# Patient Record
Sex: Female | Born: 1945
Health system: Southern US, Community
[De-identification: ages and names within clinical notes are randomized; demographics above are authoritative.]

## PROBLEM LIST (undated history)

## (undated) DIAGNOSIS — Z9989 Dependence on other enabling machines and devices: Secondary | ICD-10-CM

## (undated) DIAGNOSIS — K219 Gastro-esophageal reflux disease without esophagitis: Secondary | ICD-10-CM

## (undated) DIAGNOSIS — I5032 Chronic diastolic (congestive) heart failure: Secondary | ICD-10-CM

## (undated) DIAGNOSIS — N189 Chronic kidney disease, unspecified: Secondary | ICD-10-CM

## (undated) DIAGNOSIS — E039 Hypothyroidism, unspecified: Secondary | ICD-10-CM

## (undated) DIAGNOSIS — M1990 Unspecified osteoarthritis, unspecified site: Secondary | ICD-10-CM

## (undated) DIAGNOSIS — E669 Obesity, unspecified: Secondary | ICD-10-CM

## (undated) DIAGNOSIS — M109 Gout, unspecified: Secondary | ICD-10-CM

## (undated) DIAGNOSIS — E119 Type 2 diabetes mellitus without complications: Secondary | ICD-10-CM

## (undated) DIAGNOSIS — G4733 Obstructive sleep apnea (adult) (pediatric): Secondary | ICD-10-CM

## (undated) DIAGNOSIS — Z9289 Personal history of other medical treatment: Secondary | ICD-10-CM

## (undated) DIAGNOSIS — I10 Essential (primary) hypertension: Secondary | ICD-10-CM

## (undated) DIAGNOSIS — K760 Fatty (change of) liver, not elsewhere classified: Secondary | ICD-10-CM

## (undated) HISTORY — DX: Type 2 diabetes mellitus without complications: E11.9

## (undated) HISTORY — DX: Dependence on other enabling machines and devices: Z99.89

## (undated) HISTORY — PX: BREAST BIOPSY: SHX20

## (undated) HISTORY — DX: Personal history of other medical treatment: Z92.89

## (undated) HISTORY — DX: Chronic diastolic (congestive) heart failure: I50.32

## (undated) HISTORY — DX: Obstructive sleep apnea (adult) (pediatric): G47.33

## (undated) HISTORY — DX: Gastro-esophageal reflux disease without esophagitis: K21.9

## (undated) HISTORY — DX: Unspecified osteoarthritis, unspecified site: M19.90

## (undated) HISTORY — DX: Fatty (change of) liver, not elsewhere classified: K76.0

## (undated) HISTORY — DX: Hypothyroidism, unspecified: E03.9

## (undated) HISTORY — PX: BIOPSY BREAST: PRO8

## (undated) HISTORY — DX: Gout, unspecified: M10.9

## (undated) HISTORY — DX: Obesity, unspecified: E66.9

## (undated) HISTORY — DX: Chronic kidney disease, unspecified: N18.9

## (undated) HISTORY — DX: Essential (primary) hypertension: I10

---

## 2006-06-01 ENCOUNTER — Encounter: Admission: RE | Admit: 2006-06-01 | Discharge: 2006-08-30 | Payer: Self-pay | Admitting: Family Medicine

## 2006-10-25 ENCOUNTER — Encounter: Admission: RE | Admit: 2006-10-25 | Discharge: 2006-10-25 | Payer: Self-pay | Admitting: Obstetrics and Gynecology

## 2007-10-25 ENCOUNTER — Encounter (INDEPENDENT_AMBULATORY_CARE_PROVIDER_SITE_OTHER): Payer: Self-pay | Admitting: Family Medicine

## 2007-10-25 ENCOUNTER — Ambulatory Visit: Payer: Self-pay | Admitting: Vascular Surgery

## 2007-10-25 ENCOUNTER — Ambulatory Visit: Admission: RE | Admit: 2007-10-25 | Discharge: 2007-10-25 | Payer: Self-pay | Admitting: Family Medicine

## 2007-10-25 ENCOUNTER — Other Ambulatory Visit: Admission: RE | Admit: 2007-10-25 | Discharge: 2007-10-25 | Payer: Self-pay | Admitting: Family Medicine

## 2007-11-20 ENCOUNTER — Encounter: Admission: RE | Admit: 2007-11-20 | Discharge: 2007-11-20 | Payer: Self-pay | Admitting: Family Medicine

## 2008-11-20 ENCOUNTER — Encounter: Admission: RE | Admit: 2008-11-20 | Discharge: 2008-11-20 | Payer: Self-pay | Admitting: Family Medicine

## 2009-02-27 ENCOUNTER — Encounter: Admission: RE | Admit: 2009-02-27 | Discharge: 2009-02-27 | Payer: Self-pay | Admitting: Family Medicine

## 2009-05-19 ENCOUNTER — Encounter: Admission: RE | Admit: 2009-05-19 | Discharge: 2009-05-19 | Payer: Self-pay | Admitting: Gastroenterology

## 2009-11-23 ENCOUNTER — Encounter: Admission: RE | Admit: 2009-11-23 | Discharge: 2009-11-23 | Payer: Self-pay | Admitting: Family Medicine

## 2010-09-15 ENCOUNTER — Emergency Department (HOSPITAL_COMMUNITY): Admission: EM | Admit: 2010-09-15 | Discharge: 2010-09-15 | Payer: Self-pay | Admitting: Emergency Medicine

## 2010-11-24 ENCOUNTER — Encounter
Admission: RE | Admit: 2010-11-24 | Discharge: 2010-11-24 | Payer: Self-pay | Source: Home / Self Care | Admitting: Obstetrics and Gynecology

## 2011-03-03 LAB — URINALYSIS, ROUTINE W REFLEX MICROSCOPIC
Bilirubin Urine: NEGATIVE
Glucose, UA: NEGATIVE mg/dL
Nitrite: NEGATIVE
Protein, ur: NEGATIVE mg/dL
Specific Gravity, Urine: 1.009 (ref 1.005–1.030)
Urobilinogen, UA: 0.2 mg/dL (ref 0.0–1.0)
pH: 5.5 (ref 5.0–8.0)

## 2011-03-03 LAB — POCT I-STAT, CHEM 8
BUN: 43 mg/dL — ABNORMAL HIGH (ref 6–23)
Potassium: 4.2 mEq/L (ref 3.5–5.1)
TCO2: 24 mmol/L (ref 0–100)

## 2011-03-03 LAB — CBC
Hemoglobin: 11.2 g/dL — ABNORMAL LOW (ref 12.0–15.0)
MCV: 81.3 fL (ref 78.0–100.0)
Platelets: 187 10*3/uL (ref 150–400)

## 2011-03-03 LAB — URINE MICROSCOPIC-ADD ON

## 2011-03-03 LAB — DIFFERENTIAL
Eosinophils Absolute: 0.4 10*3/uL (ref 0.0–0.7)
Eosinophils Relative: 6 % — ABNORMAL HIGH (ref 0–5)
Lymphs Abs: 1 10*3/uL (ref 0.7–4.0)
Monocytes Absolute: 0.6 10*3/uL (ref 0.1–1.0)
Neutro Abs: 4.5 10*3/uL (ref 1.7–7.7)
Neutrophils Relative %: 69 % (ref 43–77)

## 2011-10-21 ENCOUNTER — Other Ambulatory Visit: Payer: Self-pay | Admitting: Family Medicine

## 2011-10-21 DIAGNOSIS — Z1231 Encounter for screening mammogram for malignant neoplasm of breast: Secondary | ICD-10-CM

## 2011-11-29 ENCOUNTER — Ambulatory Visit
Admission: RE | Admit: 2011-11-29 | Discharge: 2011-11-29 | Disposition: A | Discharge status: Home / Self Care | Payer: Medicare Other | Source: Ambulatory Visit | Attending: Family Medicine | Admitting: Family Medicine

## 2011-11-29 DIAGNOSIS — Z1231 Encounter for screening mammogram for malignant neoplasm of breast: Secondary | ICD-10-CM

## 2012-01-03 DIAGNOSIS — G4733 Obstructive sleep apnea (adult) (pediatric): Secondary | ICD-10-CM | POA: Diagnosis not present

## 2012-01-13 DIAGNOSIS — E1129 Type 2 diabetes mellitus with other diabetic kidney complication: Secondary | ICD-10-CM | POA: Diagnosis not present

## 2012-01-13 DIAGNOSIS — Z23 Encounter for immunization: Secondary | ICD-10-CM | POA: Diagnosis not present

## 2012-01-13 DIAGNOSIS — M1990 Unspecified osteoarthritis, unspecified site: Secondary | ICD-10-CM | POA: Diagnosis not present

## 2012-01-13 DIAGNOSIS — I10 Essential (primary) hypertension: Secondary | ICD-10-CM | POA: Diagnosis not present

## 2012-02-15 DIAGNOSIS — I10 Essential (primary) hypertension: Secondary | ICD-10-CM | POA: Diagnosis not present

## 2012-02-28 DIAGNOSIS — I10 Essential (primary) hypertension: Secondary | ICD-10-CM | POA: Diagnosis not present

## 2012-03-23 DIAGNOSIS — Z124 Encounter for screening for malignant neoplasm of cervix: Secondary | ICD-10-CM | POA: Diagnosis not present

## 2012-03-23 DIAGNOSIS — Z01419 Encounter for gynecological examination (general) (routine) without abnormal findings: Secondary | ICD-10-CM | POA: Diagnosis not present

## 2012-03-23 DIAGNOSIS — Z1151 Encounter for screening for human papillomavirus (HPV): Secondary | ICD-10-CM | POA: Diagnosis not present

## 2012-03-23 DIAGNOSIS — N951 Menopausal and female climacteric states: Secondary | ICD-10-CM | POA: Diagnosis not present

## 2012-04-25 DIAGNOSIS — E1129 Type 2 diabetes mellitus with other diabetic kidney complication: Secondary | ICD-10-CM | POA: Diagnosis not present

## 2012-04-25 DIAGNOSIS — E039 Hypothyroidism, unspecified: Secondary | ICD-10-CM | POA: Diagnosis not present

## 2012-04-25 DIAGNOSIS — I10 Essential (primary) hypertension: Secondary | ICD-10-CM | POA: Diagnosis not present

## 2012-05-21 DIAGNOSIS — H43399 Other vitreous opacities, unspecified eye: Secondary | ICD-10-CM | POA: Diagnosis not present

## 2012-08-10 DIAGNOSIS — N2581 Secondary hyperparathyroidism of renal origin: Secondary | ICD-10-CM | POA: Diagnosis not present

## 2012-08-18 DIAGNOSIS — N186 End stage renal disease: Secondary | ICD-10-CM | POA: Diagnosis not present

## 2012-08-24 DIAGNOSIS — I120 Hypertensive chronic kidney disease with stage 5 chronic kidney disease or end stage renal disease: Secondary | ICD-10-CM | POA: Diagnosis not present

## 2012-10-17 DIAGNOSIS — E1139 Type 2 diabetes mellitus with other diabetic ophthalmic complication: Secondary | ICD-10-CM | POA: Diagnosis not present

## 2012-10-17 DIAGNOSIS — H2510 Age-related nuclear cataract, unspecified eye: Secondary | ICD-10-CM | POA: Diagnosis not present

## 2012-10-29 DIAGNOSIS — E7800 Pure hypercholesterolemia, unspecified: Secondary | ICD-10-CM | POA: Diagnosis not present

## 2012-10-29 DIAGNOSIS — E119 Type 2 diabetes mellitus without complications: Secondary | ICD-10-CM | POA: Diagnosis not present

## 2012-10-29 DIAGNOSIS — K219 Gastro-esophageal reflux disease without esophagitis: Secondary | ICD-10-CM | POA: Diagnosis not present

## 2012-10-29 DIAGNOSIS — M109 Gout, unspecified: Secondary | ICD-10-CM | POA: Diagnosis not present

## 2012-10-29 DIAGNOSIS — I10 Essential (primary) hypertension: Secondary | ICD-10-CM | POA: Diagnosis not present

## 2012-10-29 DIAGNOSIS — E669 Obesity, unspecified: Secondary | ICD-10-CM | POA: Diagnosis not present

## 2012-10-29 DIAGNOSIS — E039 Hypothyroidism, unspecified: Secondary | ICD-10-CM | POA: Diagnosis not present

## 2012-10-30 ENCOUNTER — Other Ambulatory Visit: Payer: Self-pay | Admitting: Obstetrics and Gynecology

## 2012-10-30 DIAGNOSIS — Z1231 Encounter for screening mammogram for malignant neoplasm of breast: Secondary | ICD-10-CM

## 2012-10-30 DIAGNOSIS — Z23 Encounter for immunization: Secondary | ICD-10-CM | POA: Diagnosis not present

## 2012-12-11 DIAGNOSIS — E039 Hypothyroidism, unspecified: Secondary | ICD-10-CM | POA: Diagnosis not present

## 2012-12-11 DIAGNOSIS — E7800 Pure hypercholesterolemia, unspecified: Secondary | ICD-10-CM | POA: Diagnosis not present

## 2012-12-13 ENCOUNTER — Ambulatory Visit
Admission: RE | Admit: 2012-12-13 | Discharge: 2012-12-13 | Disposition: A | Discharge status: Home / Self Care | Payer: Medicare Other | Source: Ambulatory Visit | Attending: Obstetrics and Gynecology | Admitting: Obstetrics and Gynecology

## 2012-12-13 DIAGNOSIS — Z1231 Encounter for screening mammogram for malignant neoplasm of breast: Secondary | ICD-10-CM | POA: Diagnosis not present

## 2013-01-08 DIAGNOSIS — G4733 Obstructive sleep apnea (adult) (pediatric): Secondary | ICD-10-CM | POA: Diagnosis not present

## 2013-02-07 DIAGNOSIS — N2581 Secondary hyperparathyroidism of renal origin: Secondary | ICD-10-CM | POA: Diagnosis not present

## 2013-04-19 ENCOUNTER — Other Ambulatory Visit: Payer: Self-pay

## 2013-04-19 ENCOUNTER — Ambulatory Visit
Admission: RE | Admit: 2013-04-19 | Discharge: 2013-04-19 | Disposition: A | Discharge status: Home / Self Care | Payer: Medicare Other | Source: Ambulatory Visit

## 2013-04-19 DIAGNOSIS — S59919A Unspecified injury of unspecified forearm, initial encounter: Secondary | ICD-10-CM | POA: Diagnosis not present

## 2013-04-19 DIAGNOSIS — M79609 Pain in unspecified limb: Secondary | ICD-10-CM

## 2013-04-19 DIAGNOSIS — S6990XA Unspecified injury of unspecified wrist, hand and finger(s), initial encounter: Secondary | ICD-10-CM | POA: Diagnosis not present

## 2013-04-23 DIAGNOSIS — H353190 Nonexudative age-related macular degeneration, unspecified eye, stage unspecified: Secondary | ICD-10-CM | POA: Diagnosis not present

## 2013-04-29 DIAGNOSIS — M109 Gout, unspecified: Secondary | ICD-10-CM | POA: Diagnosis not present

## 2013-04-29 DIAGNOSIS — E7800 Pure hypercholesterolemia, unspecified: Secondary | ICD-10-CM | POA: Diagnosis not present

## 2013-04-29 DIAGNOSIS — E1129 Type 2 diabetes mellitus with other diabetic kidney complication: Secondary | ICD-10-CM | POA: Diagnosis not present

## 2013-04-29 DIAGNOSIS — I10 Essential (primary) hypertension: Secondary | ICD-10-CM | POA: Diagnosis not present

## 2013-04-29 DIAGNOSIS — M79609 Pain in unspecified limb: Secondary | ICD-10-CM | POA: Diagnosis not present

## 2013-04-29 DIAGNOSIS — E039 Hypothyroidism, unspecified: Secondary | ICD-10-CM | POA: Diagnosis not present

## 2013-06-06 DIAGNOSIS — N951 Menopausal and female climacteric states: Secondary | ICD-10-CM | POA: Diagnosis not present

## 2013-06-06 DIAGNOSIS — L293 Anogenital pruritus, unspecified: Secondary | ICD-10-CM | POA: Diagnosis not present

## 2013-06-06 DIAGNOSIS — E559 Vitamin D deficiency, unspecified: Secondary | ICD-10-CM | POA: Diagnosis not present

## 2013-06-06 DIAGNOSIS — Z124 Encounter for screening for malignant neoplasm of cervix: Secondary | ICD-10-CM | POA: Diagnosis not present

## 2013-06-06 DIAGNOSIS — Z01419 Encounter for gynecological examination (general) (routine) without abnormal findings: Secondary | ICD-10-CM | POA: Diagnosis not present

## 2013-08-16 DIAGNOSIS — N2581 Secondary hyperparathyroidism of renal origin: Secondary | ICD-10-CM | POA: Diagnosis not present

## 2013-08-16 DIAGNOSIS — N390 Urinary tract infection, site not specified: Secondary | ICD-10-CM | POA: Diagnosis not present

## 2013-10-05 DIAGNOSIS — Z23 Encounter for immunization: Secondary | ICD-10-CM | POA: Diagnosis not present

## 2013-10-30 DIAGNOSIS — K219 Gastro-esophageal reflux disease without esophagitis: Secondary | ICD-10-CM | POA: Diagnosis not present

## 2013-10-30 DIAGNOSIS — M109 Gout, unspecified: Secondary | ICD-10-CM | POA: Diagnosis not present

## 2013-10-30 DIAGNOSIS — E7800 Pure hypercholesterolemia, unspecified: Secondary | ICD-10-CM | POA: Diagnosis not present

## 2013-10-30 DIAGNOSIS — E039 Hypothyroidism, unspecified: Secondary | ICD-10-CM | POA: Diagnosis not present

## 2013-10-30 DIAGNOSIS — I10 Essential (primary) hypertension: Secondary | ICD-10-CM | POA: Diagnosis not present

## 2013-10-30 DIAGNOSIS — E1129 Type 2 diabetes mellitus with other diabetic kidney complication: Secondary | ICD-10-CM | POA: Diagnosis not present

## 2013-11-07 ENCOUNTER — Other Ambulatory Visit: Payer: Self-pay

## 2013-11-07 DIAGNOSIS — Z1231 Encounter for screening mammogram for malignant neoplasm of breast: Secondary | ICD-10-CM

## 2013-11-11 DIAGNOSIS — H2510 Age-related nuclear cataract, unspecified eye: Secondary | ICD-10-CM | POA: Diagnosis not present

## 2013-11-11 DIAGNOSIS — E1139 Type 2 diabetes mellitus with other diabetic ophthalmic complication: Secondary | ICD-10-CM | POA: Diagnosis not present

## 2013-12-16 ENCOUNTER — Ambulatory Visit
Admission: RE | Admit: 2013-12-16 | Discharge: 2013-12-16 | Disposition: A | Discharge status: Home / Self Care | Payer: Medicare Other | Source: Ambulatory Visit

## 2013-12-16 DIAGNOSIS — Z1231 Encounter for screening mammogram for malignant neoplasm of breast: Secondary | ICD-10-CM

## 2013-12-17 DIAGNOSIS — E7800 Pure hypercholesterolemia, unspecified: Secondary | ICD-10-CM | POA: Diagnosis not present

## 2013-12-31 DIAGNOSIS — R110 Nausea: Secondary | ICD-10-CM | POA: Diagnosis not present

## 2013-12-31 DIAGNOSIS — R42 Dizziness and giddiness: Secondary | ICD-10-CM | POA: Diagnosis not present

## 2013-12-31 DIAGNOSIS — E039 Hypothyroidism, unspecified: Secondary | ICD-10-CM | POA: Diagnosis not present

## 2014-01-09 DIAGNOSIS — G4733 Obstructive sleep apnea (adult) (pediatric): Secondary | ICD-10-CM | POA: Diagnosis not present

## 2014-05-08 DIAGNOSIS — R5383 Other fatigue: Secondary | ICD-10-CM | POA: Diagnosis not present

## 2014-05-08 DIAGNOSIS — Z23 Encounter for immunization: Secondary | ICD-10-CM | POA: Diagnosis not present

## 2014-05-08 DIAGNOSIS — R5381 Other malaise: Secondary | ICD-10-CM | POA: Diagnosis not present

## 2014-05-08 DIAGNOSIS — K219 Gastro-esophageal reflux disease without esophagitis: Secondary | ICD-10-CM | POA: Diagnosis not present

## 2014-05-08 DIAGNOSIS — Z0000 Encounter for general adult medical examination without abnormal findings: Secondary | ICD-10-CM | POA: Diagnosis not present

## 2014-05-08 DIAGNOSIS — Z79899 Other long term (current) drug therapy: Secondary | ICD-10-CM | POA: Diagnosis not present

## 2014-05-08 DIAGNOSIS — N183 Chronic kidney disease, stage 3 unspecified: Secondary | ICD-10-CM | POA: Diagnosis not present

## 2014-05-08 DIAGNOSIS — E1129 Type 2 diabetes mellitus with other diabetic kidney complication: Secondary | ICD-10-CM | POA: Diagnosis not present

## 2014-05-08 DIAGNOSIS — E039 Hypothyroidism, unspecified: Secondary | ICD-10-CM | POA: Diagnosis not present

## 2014-05-08 DIAGNOSIS — M109 Gout, unspecified: Secondary | ICD-10-CM | POA: Diagnosis not present

## 2014-05-08 DIAGNOSIS — D649 Anemia, unspecified: Secondary | ICD-10-CM | POA: Diagnosis not present

## 2014-05-08 DIAGNOSIS — E7800 Pure hypercholesterolemia, unspecified: Secondary | ICD-10-CM | POA: Diagnosis not present

## 2014-05-08 DIAGNOSIS — I10 Essential (primary) hypertension: Secondary | ICD-10-CM | POA: Diagnosis not present

## 2014-05-13 DIAGNOSIS — H353190 Nonexudative age-related macular degeneration, unspecified eye, stage unspecified: Secondary | ICD-10-CM | POA: Diagnosis not present

## 2014-06-10 DIAGNOSIS — K648 Other hemorrhoids: Secondary | ICD-10-CM | POA: Diagnosis not present

## 2014-06-10 DIAGNOSIS — D129 Benign neoplasm of anus and anal canal: Secondary | ICD-10-CM | POA: Diagnosis not present

## 2014-06-10 DIAGNOSIS — Z1211 Encounter for screening for malignant neoplasm of colon: Secondary | ICD-10-CM | POA: Diagnosis not present

## 2014-06-10 DIAGNOSIS — K621 Rectal polyp: Secondary | ICD-10-CM | POA: Diagnosis not present

## 2014-06-10 DIAGNOSIS — D128 Benign neoplasm of rectum: Secondary | ICD-10-CM | POA: Diagnosis not present

## 2014-06-10 DIAGNOSIS — K620 Anal polyp: Secondary | ICD-10-CM | POA: Diagnosis not present

## 2014-06-19 DIAGNOSIS — N183 Chronic kidney disease, stage 3 unspecified: Secondary | ICD-10-CM | POA: Diagnosis not present

## 2014-06-19 DIAGNOSIS — E1129 Type 2 diabetes mellitus with other diabetic kidney complication: Secondary | ICD-10-CM | POA: Diagnosis not present

## 2014-06-19 DIAGNOSIS — I129 Hypertensive chronic kidney disease with stage 1 through stage 4 chronic kidney disease, or unspecified chronic kidney disease: Secondary | ICD-10-CM | POA: Diagnosis not present

## 2014-08-13 DIAGNOSIS — D649 Anemia, unspecified: Secondary | ICD-10-CM | POA: Diagnosis not present

## 2014-10-17 DIAGNOSIS — Z23 Encounter for immunization: Secondary | ICD-10-CM | POA: Diagnosis not present

## 2014-10-23 DIAGNOSIS — R0602 Shortness of breath: Secondary | ICD-10-CM | POA: Diagnosis not present

## 2014-10-23 DIAGNOSIS — E1122 Type 2 diabetes mellitus with diabetic chronic kidney disease: Secondary | ICD-10-CM | POA: Diagnosis not present

## 2014-10-23 DIAGNOSIS — E780 Pure hypercholesterolemia: Secondary | ICD-10-CM | POA: Diagnosis not present

## 2014-10-23 DIAGNOSIS — I10 Essential (primary) hypertension: Secondary | ICD-10-CM | POA: Diagnosis not present

## 2014-10-23 DIAGNOSIS — D649 Anemia, unspecified: Secondary | ICD-10-CM | POA: Diagnosis not present

## 2014-11-03 ENCOUNTER — Ambulatory Visit: Payer: Medicare Other | Admitting: Cardiovascular Disease

## 2014-11-27 ENCOUNTER — Other Ambulatory Visit: Payer: Self-pay

## 2014-11-27 DIAGNOSIS — Z1231 Encounter for screening mammogram for malignant neoplasm of breast: Secondary | ICD-10-CM

## 2014-12-02 ENCOUNTER — Ambulatory Visit (INDEPENDENT_AMBULATORY_CARE_PROVIDER_SITE_OTHER): Payer: Medicare Other | Admitting: Cardiovascular Disease

## 2014-12-02 ENCOUNTER — Encounter: Payer: Self-pay | Admitting: Cardiovascular Disease

## 2014-12-02 VITALS — BP 124/64 | HR 92 | Ht 64.0 in | Wt 225.0 lb

## 2014-12-02 DIAGNOSIS — R0602 Shortness of breath: Secondary | ICD-10-CM | POA: Diagnosis not present

## 2014-12-02 DIAGNOSIS — R0609 Other forms of dyspnea: Secondary | ICD-10-CM | POA: Diagnosis not present

## 2014-12-02 NOTE — Patient Instructions (Signed)
Your physician recommends that you continue on your current medications as directed. Please refer to the Current Medication list given to you today.  Your physician has requested that you have an echocardiogram. Echocardiography is a painless test that uses sound waves to create images of your heart. It provides your doctor with information about the size and shape of your heart and how well your heart's chambers and valves are working. This procedure takes approximately one hour. There are no restrictions for this procedure.  Your physician wants you to follow-up in: 6 months with Dr. Nahser.  You will receive a reminder letter in the mail two months in advance. If you don't receive a letter, please call our office to schedule the follow-up appointment.  

## 2014-12-02 NOTE — Assessment & Plan Note (Signed)
Mrs. Jody Oneill  with some dyspnea on exertion. She has a history of high blood pressure. Her blood pressure has been fairly well controlled.  I think that her dyspnea is likely due to be somewhat deconditioned. She has not been walking much because of some back injuries. We'll get an echocardiogram to ensure that her LV function is good.  Encouraged her to start increasing her exercise regimen including some treadmill and stepper machine. I'll see her back in 6 months for followup visit.

## 2014-12-02 NOTE — Progress Notes (Signed)
Zachery Dakins Date of Birth  27-Oct-1946       [facility] Office [address], [place], [place]  [address]   [place], [place]  [address] [place]   Fax  [phone]     Fax [phone]  Problem List: 1. DM 2 2. Dyspnea 3. Obstructive sleep apnea   History of Present Illness:  Ms. Finamore is a 68 yo who is referred by Dr. Darcus Austin for episodes of dyspnea.  Ms. Varble has been having DOE - especially walking up hills or steps. Exercises  3 times a week .  Has not had any difficulty with that.   Water aerobics.  Also does silver sneakers.  Going up stairs has been more difficult. Not associated with CP or diaphoresis.  Has had some fatigue No PND or orthopnea.  Has OSA  - uses CPAP Avoids salt,    Nonsmoker ETOH - occasionally Fhx:  No cardiac hx   Current Outpatient Prescriptions on File Prior to Visit  Medication Sig Dispense Refill  . allopurinol (ZYLOPRIM) 300 MG tablet Take 300 mg by mouth daily.    Marland Kitchen aspirin EC 81 MG tablet Take 81 mg by mouth daily.    . clobetasol cream (TEMOVATE) 9.75 % Apply 1 application topically 2 (two) times daily.    . furosemide (LASIX) 20 MG tablet Take 20 mg by mouth.    Marland Kitchen glipiZIDE (GLUCOTROL) 10 MG tablet Take 10 mg by mouth daily before breakfast.    . hydrALAZINE (APRESOLINE) 50 MG tablet Take 50 mg by mouth 2 (two) times daily.     Marland Kitchen levothyroxine (SYNTHROID, LEVOTHROID) 88 MCG tablet Take 88 mcg by mouth daily before breakfast.    . lisinopril (PRINIVIL,ZESTRIL) 40 MG tablet Take 40 mg by mouth daily.    . metoprolol (TOPROL-XL) 200 MG 24 hr tablet Take 200 mg by mouth daily.    . Multiple Vitamin (MULTIVITAMIN) tablet Take 1 tablet by mouth daily.    . pravastatin (PRAVACHOL) 20 MG tablet Take 20 mg by mouth daily.     No current facility-administered medications on file prior to visit.    No Known Allergies  Past Medical History    Diagnosis Date  . Diabetes     type 2  . Thyroid disease   . Chronic kidney disease     chronic   . Hypertension     hypertension  . GERD (gastroesophageal reflux disease)   . Obesity     fatty liver by [place]  . Osteoarthritis   . Gout   . Obstructive sleep apnea     Past Surgical History  Procedure Laterality Date  . Biopsy breast      LEFT BREAST//YEARS AGO    History  Smoking status  . Never Smoker   Smokeless tobacco  . Not on file    History  Alcohol Use: Not on file    No family history on file.  Reviw of Systems:  Reviewed in the HPI.  All other systems are negative.  Physical Exam: Blood pressure 124/64, pulse 92, height 5\' 4"  (1.626 m), weight 225 lb (102.059 kg). Wt Readings from Last 3 Encounters:  12/02/14 225 lb (102.059 kg)     General: Well developed, well nourished, in no acute distress.  Head: Normocephalic, atraumatic, sclera non-icteric, mucus membranes are moist,   Neck: Supple. Carotids are 2 + without  bruits. No JVD   Lungs: Clear   Heart: RR, normal S1S2, no murmurs  Abdomen: Soft, non-tender, non-distended with normal bowel sounds.  Msk:  Strength and tone are normal   Extremities: No clubbing or cyanosis. No edema.  Distal pedal pulses are 2+ and equal    Neuro: CN II - XII intact.  Alert and oriented X 3.   Psych:  Normal   ECG: Dec. 15, 2015:  NSR at 92.  Poor R wave progression.   Assessment / Plan:

## 2014-12-03 DIAGNOSIS — K1120 Sialoadenitis, unspecified: Secondary | ICD-10-CM | POA: Diagnosis not present

## 2014-12-05 ENCOUNTER — Ambulatory Visit (HOSPITAL_COMMUNITY): Payer: Medicare Other | Attending: Cardiovascular Disease | Admitting: Cardiology

## 2014-12-05 DIAGNOSIS — R0602 Shortness of breath: Secondary | ICD-10-CM

## 2014-12-05 NOTE — Progress Notes (Signed)
Echo performed. 

## 2014-12-08 ENCOUNTER — Telehealth: Payer: Self-pay | Admitting: Cardiovascular Disease

## 2014-12-08 NOTE — Telephone Encounter (Signed)
New Msg     Pt returning call about test results, please call back.

## 2014-12-08 NOTE — Telephone Encounter (Signed)
Pt aware of echo results 

## 2014-12-09 DIAGNOSIS — H35033 Hypertensive retinopathy, bilateral: Secondary | ICD-10-CM | POA: Diagnosis not present

## 2014-12-09 DIAGNOSIS — H3531 Nonexudative age-related macular degeneration: Secondary | ICD-10-CM | POA: Diagnosis not present

## 2014-12-09 DIAGNOSIS — E1339 Other specified diabetes mellitus with other diabetic ophthalmic complication: Secondary | ICD-10-CM | POA: Diagnosis not present

## 2014-12-17 ENCOUNTER — Ambulatory Visit
Admission: RE | Admit: 2014-12-17 | Discharge: 2014-12-17 | Disposition: A | Discharge status: Home / Self Care | Payer: Medicare Other | Source: Ambulatory Visit

## 2014-12-17 DIAGNOSIS — Z1231 Encounter for screening mammogram for malignant neoplasm of breast: Secondary | ICD-10-CM

## 2014-12-30 DIAGNOSIS — H53453 Other localized visual field defect, bilateral: Secondary | ICD-10-CM | POA: Diagnosis not present

## 2014-12-30 DIAGNOSIS — E1339 Other specified diabetes mellitus with other diabetic ophthalmic complication: Secondary | ICD-10-CM | POA: Diagnosis not present

## 2014-12-30 DIAGNOSIS — H35033 Hypertensive retinopathy, bilateral: Secondary | ICD-10-CM | POA: Diagnosis not present

## 2015-01-05 DIAGNOSIS — G4733 Obstructive sleep apnea (adult) (pediatric): Secondary | ICD-10-CM | POA: Diagnosis not present

## 2015-01-20 DIAGNOSIS — N183 Chronic kidney disease, stage 3 (moderate): Secondary | ICD-10-CM | POA: Diagnosis not present

## 2015-06-01 DIAGNOSIS — E039 Hypothyroidism, unspecified: Secondary | ICD-10-CM | POA: Diagnosis not present

## 2015-06-01 DIAGNOSIS — I10 Essential (primary) hypertension: Secondary | ICD-10-CM | POA: Diagnosis not present

## 2015-06-01 DIAGNOSIS — Z1389 Encounter for screening for other disorder: Secondary | ICD-10-CM | POA: Diagnosis not present

## 2015-06-01 DIAGNOSIS — N183 Chronic kidney disease, stage 3 (moderate): Secondary | ICD-10-CM | POA: Diagnosis not present

## 2015-06-01 DIAGNOSIS — E780 Pure hypercholesterolemia: Secondary | ICD-10-CM | POA: Diagnosis not present

## 2015-06-01 DIAGNOSIS — E1122 Type 2 diabetes mellitus with diabetic chronic kidney disease: Secondary | ICD-10-CM | POA: Diagnosis not present

## 2015-06-01 DIAGNOSIS — K219 Gastro-esophageal reflux disease without esophagitis: Secondary | ICD-10-CM | POA: Diagnosis not present

## 2015-06-01 DIAGNOSIS — Z0000 Encounter for general adult medical examination without abnormal findings: Secondary | ICD-10-CM | POA: Diagnosis not present

## 2015-06-01 DIAGNOSIS — M1000 Idiopathic gout, unspecified site: Secondary | ICD-10-CM | POA: Diagnosis not present

## 2015-06-12 ENCOUNTER — Encounter: Payer: Self-pay | Admitting: *Deleted

## 2015-06-15 ENCOUNTER — Ambulatory Visit (INDEPENDENT_AMBULATORY_CARE_PROVIDER_SITE_OTHER): Payer: Medicare Other | Admitting: Cardiovascular Disease

## 2015-06-15 ENCOUNTER — Encounter: Payer: Self-pay | Admitting: *Deleted

## 2015-06-15 ENCOUNTER — Other Ambulatory Visit: Payer: Self-pay | Admitting: *Deleted

## 2015-06-15 VITALS — BP 124/64 | HR 54 | Ht 64.0 in | Wt 221.0 lb

## 2015-06-15 DIAGNOSIS — I5032 Chronic diastolic (congestive) heart failure: Secondary | ICD-10-CM

## 2015-06-15 HISTORY — DX: Chronic diastolic (congestive) heart failure: I50.32

## 2015-06-15 NOTE — Progress Notes (Signed)
Jody Oneill Date of Birth  21-May-1946       [facility] Office [address], [place], [place]  [address]   [place], [place]  [address] [place]   Fax  [phone]     Fax [phone]  Problem List: 1. DM 2 2. Dyspnea 3. Obstructive sleep apnea 4. Chronic diastolic CHF   History of Present Illness:  Jody Oneill is a 69 yo who is referred by Dr. Darcus Austin for episodes of dyspnea.  Jody Oneill has been having DOE - especially walking up hills or steps. Exercises  3 times a week .  Has not had any difficulty with that.   Water aerobics.  Also does silver sneakers.  Going up stairs has been more difficult. Not associated with CP or diaphoresis.  Has had some fatigue No PND or orthopnea.  Has OSA  - uses CPAP Avoids salt,    Nonsmoker ETOH - occasionally Fhx:  No cardiac hx   June 15, 2015:  Jody Oneill is seen back for  a 6 month visit for dyspnea. Her echo showed: Left ventricle: The cavity size was normal. There was mild focal basal hypertrophy of the septum. Systolic function was normal. The estimated ejection fraction was in the range of 55% to 60%. Wall motion was normal; there were no regional wall motion abnormalities. Features are consistent with a pseudonormal left ventricular filling pattern, with concomitant abnormal relaxation and increased filling pressure (grade 2 diastolic dysfunction). - Left atrium: The atrium was mildly dilated. - Pulmonary arteries: PA peak pressure: 36 mm Hg   Has been exercising some .  Breathing is ok.     Current Outpatient Prescriptions on File Prior to Visit  Medication Sig Dispense Refill  . allopurinol (ZYLOPRIM) 300 MG tablet Take 300 mg by mouth daily.    Marland Kitchen aspirin EC 81 MG tablet Take 81 mg by mouth daily.    . clobetasol cream (TEMOVATE) 5.78 % Apply 1 application topically 2 (two) times daily.    . furosemide (LASIX)  20 MG tablet Take 20 mg by mouth 2 (two) times daily.     . hydrALAZINE (APRESOLINE) 50 MG tablet Take 100 mg by mouth 2 (two) times daily.     . lansoprazole (PREVACID) 30 MG capsule Take 30 mg by mouth daily.  4  . levothyroxine (SYNTHROID, LEVOTHROID) 88 MCG tablet Take 88 mcg by mouth daily before breakfast.    . lisinopril (PRINIVIL,ZESTRIL) 40 MG tablet Take 40 mg by mouth daily.    . metoprolol (TOPROL-XL) 200 MG 24 hr tablet Take 200 mg by mouth daily.    . Multiple Vitamin (MULTIVITAMIN) tablet Take 1 tablet by mouth daily.    . pravastatin (PRAVACHOL) 20 MG tablet Take 20 mg by mouth daily.     No current facility-administered medications on file prior to visit.    No Known Allergies  Past Medical History  Diagnosis Date  . Type 2 diabetes mellitus   . Hypothyroid   . Chronic kidney disease   . Hypertension   . GERD (gastroesophageal reflux disease)   . Obesity     fatty liver by [place]  . Osteoarthritis   . Gout   . OSA on CPAP   . Fatty liver     Past Surgical History  Procedure Laterality Date  . Biopsy breast Left     many years ago  History  Smoking status  . Never Smoker   Smokeless tobacco  . Not on file    History  Alcohol Use: Not on file    Family History  Problem Relation Age of Onset  . Lung cancer Mother   . Other Father     meningitis  . Diabetes Father     Jody Oneill:  Reviewed in the HPI.  All other Oneill are negative.  Physical Exam: Blood pressure 124/64, pulse 54, height 5\' 4"  (1.626 m), weight 100.245 kg (221 lb), SpO2 99 %. Wt Readings from Last 3 Encounters:  06/15/15 100.245 kg (221 lb)  12/02/14 102.059 kg (225 lb)   General: Well developed, well nourished, in no acute distress.  Head: Normocephalic, atraumatic, sclera non-icteric, mucus membranes are moist,   Neck: Supple. Carotids are 2 + without bruits. No JVD   Lungs: Clear   Heart: RR, normal S1S2, no murmurs  Abdomen: Soft, non-tender, non-distended  with normal bowel sounds.  Msk:  Strength and tone are normal   Extremities: No clubbing or cyanosis. No edema.  Distal pedal pulses are 2+ and equal    Neuro: CN II - XII intact.  Alert and oriented X 3.   Psych:  Normal   ECG:   Assessment / Plan:   1. Chronic diastolic CHF - her BP and HR are very well controlled.  No new recommendations . I've advised her to work on a better diet , exercise, and weight loss program .    I will see her on an as needed basis .   2. Dyspnea- she has chronic diastolic CHF and is also obese.   Have advised her to work on her weight    3. Obstructive sleep apnea  4. DM 2   Crystina Borrayo, Wonda Cheng, MD  06/15/2015 2:50 PM    [address],  [place] [place], [place]  [address] Pager [phone] Phone: [phone]; Fax: [phone]   [facility]  [address] [place], [place]  [address] [phone]    Fax [phone]

## 2015-06-15 NOTE — Patient Instructions (Signed)
Medication Instructions:  Your physician recommends that you continue on your current medications as directed. Please refer to the Current Medication list given to you today.   Labwork: None Ordered   Testing/Procedures: None Ordered   Follow-Up: Your physician recommends that you schedule a follow-up appointment in: as needed with Dr. Nahser     

## 2015-06-30 DIAGNOSIS — H2510 Age-related nuclear cataract, unspecified eye: Secondary | ICD-10-CM | POA: Diagnosis not present

## 2015-06-30 DIAGNOSIS — H3531 Nonexudative age-related macular degeneration: Secondary | ICD-10-CM | POA: Diagnosis not present

## 2015-08-25 DIAGNOSIS — N183 Chronic kidney disease, stage 3 (moderate): Secondary | ICD-10-CM | POA: Diagnosis not present

## 2015-08-25 DIAGNOSIS — I129 Hypertensive chronic kidney disease with stage 1 through stage 4 chronic kidney disease, or unspecified chronic kidney disease: Secondary | ICD-10-CM | POA: Diagnosis not present

## 2015-08-25 DIAGNOSIS — N2581 Secondary hyperparathyroidism of renal origin: Secondary | ICD-10-CM | POA: Diagnosis not present

## 2015-08-25 DIAGNOSIS — E1129 Type 2 diabetes mellitus with other diabetic kidney complication: Secondary | ICD-10-CM | POA: Diagnosis not present

## 2015-09-05 DIAGNOSIS — Z23 Encounter for immunization: Secondary | ICD-10-CM | POA: Diagnosis not present

## 2015-11-17 ENCOUNTER — Other Ambulatory Visit: Payer: Self-pay

## 2015-11-17 DIAGNOSIS — Z1231 Encounter for screening mammogram for malignant neoplasm of breast: Secondary | ICD-10-CM

## 2015-12-07 DIAGNOSIS — E039 Hypothyroidism, unspecified: Secondary | ICD-10-CM | POA: Diagnosis not present

## 2015-12-07 DIAGNOSIS — N183 Chronic kidney disease, stage 3 (moderate): Secondary | ICD-10-CM | POA: Diagnosis not present

## 2015-12-07 DIAGNOSIS — E7800 Pure hypercholesterolemia, unspecified: Secondary | ICD-10-CM | POA: Diagnosis not present

## 2015-12-07 DIAGNOSIS — I10 Essential (primary) hypertension: Secondary | ICD-10-CM | POA: Diagnosis not present

## 2015-12-07 DIAGNOSIS — E1122 Type 2 diabetes mellitus with diabetic chronic kidney disease: Secondary | ICD-10-CM | POA: Diagnosis not present

## 2015-12-07 DIAGNOSIS — M1000 Idiopathic gout, unspecified site: Secondary | ICD-10-CM | POA: Diagnosis not present

## 2015-12-22 ENCOUNTER — Ambulatory Visit
Admission: RE | Admit: 2015-12-22 | Discharge: 2015-12-22 | Disposition: A | Discharge status: Home / Self Care | Payer: Medicare Other | Source: Ambulatory Visit

## 2015-12-22 DIAGNOSIS — Z1231 Encounter for screening mammogram for malignant neoplasm of breast: Secondary | ICD-10-CM

## 2016-01-04 DIAGNOSIS — E1339 Other specified diabetes mellitus with other diabetic ophthalmic complication: Secondary | ICD-10-CM | POA: Diagnosis not present

## 2016-01-04 DIAGNOSIS — H35033 Hypertensive retinopathy, bilateral: Secondary | ICD-10-CM | POA: Diagnosis not present

## 2016-01-04 DIAGNOSIS — H524 Presbyopia: Secondary | ICD-10-CM | POA: Diagnosis not present

## 2016-01-06 DIAGNOSIS — G4733 Obstructive sleep apnea (adult) (pediatric): Secondary | ICD-10-CM | POA: Diagnosis not present

## 2016-02-02 DIAGNOSIS — H353132 Nonexudative age-related macular degeneration, bilateral, intermediate dry stage: Secondary | ICD-10-CM | POA: Diagnosis not present

## 2016-03-01 DIAGNOSIS — H2511 Age-related nuclear cataract, right eye: Secondary | ICD-10-CM | POA: Diagnosis not present

## 2016-03-01 DIAGNOSIS — H2512 Age-related nuclear cataract, left eye: Secondary | ICD-10-CM | POA: Diagnosis not present

## 2016-03-01 DIAGNOSIS — H18411 Arcus senilis, right eye: Secondary | ICD-10-CM | POA: Diagnosis not present

## 2016-03-01 DIAGNOSIS — H18412 Arcus senilis, left eye: Secondary | ICD-10-CM | POA: Diagnosis not present

## 2016-04-18 DIAGNOSIS — H25811 Combined forms of age-related cataract, right eye: Secondary | ICD-10-CM | POA: Diagnosis not present

## 2016-04-18 DIAGNOSIS — H2511 Age-related nuclear cataract, right eye: Secondary | ICD-10-CM | POA: Diagnosis not present

## 2016-04-18 DIAGNOSIS — H2510 Age-related nuclear cataract, unspecified eye: Secondary | ICD-10-CM | POA: Diagnosis not present

## 2016-04-19 DIAGNOSIS — H2512 Age-related nuclear cataract, left eye: Secondary | ICD-10-CM | POA: Diagnosis not present

## 2016-04-25 DIAGNOSIS — H2510 Age-related nuclear cataract, unspecified eye: Secondary | ICD-10-CM | POA: Diagnosis not present

## 2016-04-25 DIAGNOSIS — H2511 Age-related nuclear cataract, right eye: Secondary | ICD-10-CM | POA: Diagnosis not present

## 2016-05-09 DIAGNOSIS — H2512 Age-related nuclear cataract, left eye: Secondary | ICD-10-CM | POA: Diagnosis not present

## 2016-05-09 DIAGNOSIS — H25812 Combined forms of age-related cataract, left eye: Secondary | ICD-10-CM | POA: Diagnosis not present

## 2016-06-06 DIAGNOSIS — Z7984 Long term (current) use of oral hypoglycemic drugs: Secondary | ICD-10-CM | POA: Diagnosis not present

## 2016-06-06 DIAGNOSIS — E039 Hypothyroidism, unspecified: Secondary | ICD-10-CM | POA: Diagnosis not present

## 2016-06-06 DIAGNOSIS — I10 Essential (primary) hypertension: Secondary | ICD-10-CM | POA: Diagnosis not present

## 2016-06-06 DIAGNOSIS — E1122 Type 2 diabetes mellitus with diabetic chronic kidney disease: Secondary | ICD-10-CM | POA: Diagnosis not present

## 2016-06-06 DIAGNOSIS — M1000 Idiopathic gout, unspecified site: Secondary | ICD-10-CM | POA: Diagnosis not present

## 2016-06-06 DIAGNOSIS — E7800 Pure hypercholesterolemia, unspecified: Secondary | ICD-10-CM | POA: Diagnosis not present

## 2016-06-06 DIAGNOSIS — Z0000 Encounter for general adult medical examination without abnormal findings: Secondary | ICD-10-CM | POA: Diagnosis not present

## 2016-07-19 DIAGNOSIS — Z01419 Encounter for gynecological examination (general) (routine) without abnormal findings: Secondary | ICD-10-CM | POA: Diagnosis not present

## 2016-07-19 DIAGNOSIS — B373 Candidiasis of vulva and vagina: Secondary | ICD-10-CM | POA: Diagnosis not present

## 2016-07-19 DIAGNOSIS — Z124 Encounter for screening for malignant neoplasm of cervix: Secondary | ICD-10-CM | POA: Diagnosis not present

## 2016-07-21 ENCOUNTER — Other Ambulatory Visit: Payer: Self-pay | Admitting: Obstetrics and Gynecology

## 2016-07-21 DIAGNOSIS — Z1231 Encounter for screening mammogram for malignant neoplasm of breast: Secondary | ICD-10-CM

## 2016-07-21 DIAGNOSIS — E2839 Other primary ovarian failure: Secondary | ICD-10-CM

## 2016-08-30 DIAGNOSIS — J069 Acute upper respiratory infection, unspecified: Secondary | ICD-10-CM | POA: Diagnosis not present

## 2016-08-30 DIAGNOSIS — I10 Essential (primary) hypertension: Secondary | ICD-10-CM | POA: Diagnosis not present

## 2016-10-12 DIAGNOSIS — Z23 Encounter for immunization: Secondary | ICD-10-CM | POA: Diagnosis not present

## 2016-11-27 DIAGNOSIS — M25512 Pain in left shoulder: Secondary | ICD-10-CM | POA: Diagnosis not present

## 2016-11-27 DIAGNOSIS — E119 Type 2 diabetes mellitus without complications: Secondary | ICD-10-CM | POA: Diagnosis not present

## 2016-11-28 DIAGNOSIS — I129 Hypertensive chronic kidney disease with stage 1 through stage 4 chronic kidney disease, or unspecified chronic kidney disease: Secondary | ICD-10-CM | POA: Diagnosis not present

## 2016-11-28 DIAGNOSIS — N2581 Secondary hyperparathyroidism of renal origin: Secondary | ICD-10-CM | POA: Diagnosis not present

## 2016-11-28 DIAGNOSIS — N183 Chronic kidney disease, stage 3 (moderate): Secondary | ICD-10-CM | POA: Diagnosis not present

## 2016-11-28 DIAGNOSIS — E1129 Type 2 diabetes mellitus with other diabetic kidney complication: Secondary | ICD-10-CM | POA: Diagnosis not present

## 2016-11-30 DIAGNOSIS — N189 Chronic kidney disease, unspecified: Secondary | ICD-10-CM | POA: Diagnosis not present

## 2016-12-05 DIAGNOSIS — M25512 Pain in left shoulder: Secondary | ICD-10-CM | POA: Diagnosis not present

## 2016-12-05 DIAGNOSIS — M5412 Radiculopathy, cervical region: Secondary | ICD-10-CM | POA: Diagnosis not present

## 2016-12-22 ENCOUNTER — Ambulatory Visit
Admission: RE | Admit: 2016-12-22 | Discharge: 2016-12-22 | Disposition: A | Discharge status: Home / Self Care | Payer: Medicare Other | Source: Ambulatory Visit | Attending: Obstetrics and Gynecology | Admitting: Obstetrics and Gynecology

## 2016-12-22 DIAGNOSIS — Z1231 Encounter for screening mammogram for malignant neoplasm of breast: Secondary | ICD-10-CM | POA: Diagnosis not present

## 2016-12-22 DIAGNOSIS — M85852 Other specified disorders of bone density and structure, left thigh: Secondary | ICD-10-CM | POA: Diagnosis not present

## 2016-12-22 DIAGNOSIS — E2839 Other primary ovarian failure: Secondary | ICD-10-CM

## 2016-12-22 DIAGNOSIS — Z780 Asymptomatic menopausal state: Secondary | ICD-10-CM | POA: Diagnosis not present

## 2016-12-27 DIAGNOSIS — E559 Vitamin D deficiency, unspecified: Secondary | ICD-10-CM | POA: Diagnosis not present

## 2016-12-27 DIAGNOSIS — Z1321 Encounter for screening for nutritional disorder: Secondary | ICD-10-CM | POA: Diagnosis not present

## 2016-12-28 DIAGNOSIS — E1339 Other specified diabetes mellitus with other diabetic ophthalmic complication: Secondary | ICD-10-CM | POA: Diagnosis not present

## 2017-01-12 DIAGNOSIS — E039 Hypothyroidism, unspecified: Secondary | ICD-10-CM | POA: Diagnosis not present

## 2017-01-12 DIAGNOSIS — I10 Essential (primary) hypertension: Secondary | ICD-10-CM | POA: Diagnosis not present

## 2017-01-12 DIAGNOSIS — E7800 Pure hypercholesterolemia, unspecified: Secondary | ICD-10-CM | POA: Diagnosis not present

## 2017-01-12 DIAGNOSIS — Z7984 Long term (current) use of oral hypoglycemic drugs: Secondary | ICD-10-CM | POA: Diagnosis not present

## 2017-01-12 DIAGNOSIS — M1000 Idiopathic gout, unspecified site: Secondary | ICD-10-CM | POA: Diagnosis not present

## 2017-01-12 DIAGNOSIS — E1122 Type 2 diabetes mellitus with diabetic chronic kidney disease: Secondary | ICD-10-CM | POA: Diagnosis not present

## 2017-02-06 DIAGNOSIS — G4733 Obstructive sleep apnea (adult) (pediatric): Secondary | ICD-10-CM | POA: Diagnosis not present

## 2017-07-13 DIAGNOSIS — E7800 Pure hypercholesterolemia, unspecified: Secondary | ICD-10-CM | POA: Diagnosis not present

## 2017-07-13 DIAGNOSIS — M1990 Unspecified osteoarthritis, unspecified site: Secondary | ICD-10-CM | POA: Diagnosis not present

## 2017-07-13 DIAGNOSIS — R0609 Other forms of dyspnea: Secondary | ICD-10-CM | POA: Diagnosis not present

## 2017-07-13 DIAGNOSIS — M1000 Idiopathic gout, unspecified site: Secondary | ICD-10-CM | POA: Diagnosis not present

## 2017-07-13 DIAGNOSIS — Z0000 Encounter for general adult medical examination without abnormal findings: Secondary | ICD-10-CM | POA: Diagnosis not present

## 2017-07-13 DIAGNOSIS — G4733 Obstructive sleep apnea (adult) (pediatric): Secondary | ICD-10-CM | POA: Diagnosis not present

## 2017-07-13 DIAGNOSIS — E039 Hypothyroidism, unspecified: Secondary | ICD-10-CM | POA: Diagnosis not present

## 2017-07-13 DIAGNOSIS — K219 Gastro-esophageal reflux disease without esophagitis: Secondary | ICD-10-CM | POA: Diagnosis not present

## 2017-07-13 DIAGNOSIS — E1122 Type 2 diabetes mellitus with diabetic chronic kidney disease: Secondary | ICD-10-CM | POA: Diagnosis not present

## 2017-07-13 DIAGNOSIS — I10 Essential (primary) hypertension: Secondary | ICD-10-CM | POA: Diagnosis not present

## 2017-07-13 DIAGNOSIS — Z1159 Encounter for screening for other viral diseases: Secondary | ICD-10-CM | POA: Diagnosis not present

## 2017-07-24 DIAGNOSIS — Z01419 Encounter for gynecological examination (general) (routine) without abnormal findings: Secondary | ICD-10-CM | POA: Diagnosis not present

## 2017-07-24 DIAGNOSIS — Z124 Encounter for screening for malignant neoplasm of cervix: Secondary | ICD-10-CM | POA: Diagnosis not present

## 2017-07-27 ENCOUNTER — Telehealth: Payer: Self-pay

## 2017-07-27 NOTE — Telephone Encounter (Signed)
ERROR

## 2017-07-28 ENCOUNTER — Encounter: Payer: Self-pay | Admitting: Physician Assistant

## 2017-07-28 ENCOUNTER — Ambulatory Visit (INDEPENDENT_AMBULATORY_CARE_PROVIDER_SITE_OTHER): Payer: Medicare Other | Admitting: Physician Assistant

## 2017-07-28 VITALS — BP 160/72 | HR 62 | Ht 64.0 in | Wt 227.6 lb

## 2017-07-28 DIAGNOSIS — E119 Type 2 diabetes mellitus without complications: Secondary | ICD-10-CM | POA: Insufficient documentation

## 2017-07-28 DIAGNOSIS — I4510 Unspecified right bundle-branch block: Secondary | ICD-10-CM

## 2017-07-28 DIAGNOSIS — I5032 Chronic diastolic (congestive) heart failure: Secondary | ICD-10-CM

## 2017-07-28 DIAGNOSIS — R0602 Shortness of breath: Secondary | ICD-10-CM | POA: Diagnosis not present

## 2017-07-28 DIAGNOSIS — N183 Chronic kidney disease, stage 3 unspecified: Secondary | ICD-10-CM | POA: Insufficient documentation

## 2017-07-28 DIAGNOSIS — I10 Essential (primary) hypertension: Secondary | ICD-10-CM | POA: Insufficient documentation

## 2017-07-28 NOTE — Progress Notes (Addendum)
Cardiology Office Note:    Date:  07/28/2017   ID:  Jody Oneill, DOB [birth year], MRN [id number]  PCP:  Darcus Austin, MD  Cardiologist:  Dr. Liam Rogers   Nephrologist: Dr. Mercy Moore  Referring MD: Darcus Austin, MD   Chief Complaint  Patient presents with  . Shortness of Breath    History of Present Illness:    Jody Oneill is a 71 y.o. female with a hx of diastolic HF, OSA, DM2, HTN, CKD is referred by Dr. Inda Merlin for evaluation of shortness of breath.  Last seen by Dr. Liam Rogers in 6/16.    [name] is here alone. She has noted shortness of breath with more moderate activities for some time.  She has occasional chest pain that is brief and not related to exercise.  She denies paroxysmal nocturnal dyspnea, syncope, arm/jaw pain.  She wears CPAP at night.  She does not feel that her shortness of breath is getting worse.    Prior CV studies:   The following studies were reviewed today:  Echo 12/05/14 - Left ventricle: The cavity size was normal. There was mild focal basal hypertrophy of the septum. Systolic function was normal. The estimated ejection fraction was in the range of 55% to 60%. Wall motion was normal; there were no regional wall motion abnormalities. Features are consistent with a pseudonormal left ventricular filling pattern, with concomitant abnormal relaxation and increased filling pressure (grade 2 diastolic dysfunction). - Left atrium: The atrium was mildly dilated. - Pulmonary arteries: PA peak pressure: 36 mm Hg (S).  Past Medical History:  Diagnosis Date  . Chronic kidney disease   . Fatty liver   . GERD (gastroesophageal reflux disease)   . Gout   . Hypertension   . Hypothyroid   . Obesity    fatty liver by [place]  . OSA on CPAP   . Osteoarthritis   . Type 2 diabetes mellitus ([place])     Past Surgical History:  Procedure Laterality Date  . BIOPSY BREAST Left    many years ago    Current Medications: Current Meds  Medication  Sig  . allopurinol (ZYLOPRIM) 300 MG tablet Take 300 mg by mouth every other day.   Marland Kitchen aspirin EC 81 MG tablet Take 81 mg by mouth daily.  . clobetasol cream (TEMOVATE) 5.70 % Apply 1 application topically 2 (two) times daily.  . furosemide (LASIX) 20 MG tablet Take 20 mg by mouth 2 (two) times daily.   Marland Kitchen GLIPIZIDE XL 10 MG 24 hr tablet Take 1 tablet by mouth daily.  . hydrALAZINE (APRESOLINE) 50 MG tablet Take 100 mg by mouth 2 (two) times daily.   . lansoprazole (PREVACID) 30 MG capsule Take 30 mg by mouth daily.  Marland Kitchen levothyroxine (SYNTHROID, LEVOTHROID) 88 MCG tablet Take 88 mcg by mouth daily before breakfast.  . lisinopril (PRINIVIL,ZESTRIL) 40 MG tablet Take 40 mg by mouth daily.  . metoprolol (TOPROL-XL) 200 MG 24 hr tablet Take 200 mg by mouth daily.  . Multiple Vitamin (MULTIVITAMIN) tablet Take 1 tablet by mouth daily.  . pravastatin (PRAVACHOL) 20 MG tablet Take 20 mg by mouth daily.     Allergies:   Amlodipine besylate and Hydrochlorothiazide   Social History   Social History  . Marital status: Widowed    Spouse name: N/A  . Number of children: N/A  . Years of education: N/A   Social History Main Topics  . Smoking status: Never Smoker  . Smokeless tobacco: Never Used  .  Alcohol use None  . Drug use: Unknown  . Sexual activity: Not Asked   Other Topics Concern  . None   Social History Narrative   Tobacco use  Cigarettes: Never smoked ,Tobacco history updated 10/23/2014   Alcohol  : yes occasionally . Caffeine  Yes, rare no recreational drugs .   Exercise Yes   -three times a week water aerobics   Occupation unemployed /retired   Martial status single widowed (2006)    2 daughters    Seat belt use - yes           Family Hx: The patient's family history includes Diabetes in her father; Lung cancer in her mother; Other in her father.  ROS:   Please see the history of present illness.    Review of Systems  Cardiovascular: Positive for dyspnea on exertion.    Hematologic/Lymphatic: Bruises/bleeds easily.  Musculoskeletal: Positive for back pain.  Gastrointestinal: Positive for nausea.  Neurological: Positive for loss of balance.   All other systems reviewed and are negative.   EKGs/Labs/Other Test Reviewed:    EKG:  EKG is   ordered today.  The ekg ordered today demonstrates NSR, HR 62, RBBB, QTc 479 ms - since last tracing in 2015, the RBBB is new.  Recent Labs: No results found for requested labs within last 8760 hours.  Labs from PCP 07/14/17: TC 175, HDL 57, LDL 93, Creatinine 1.32, K 3.8, ALT 20  Recent Lipid Panel No results found for: CHOL, TRIG, HDL, CHOLHDL, LDLCALC, LDLDIRECT  Physical Exam:    VS:  BP (!) 160/72   Pulse 62   Ht 5\' 4"  (1.626 m)   Wt 227 lb 9.6 oz (103.2 kg)   SpO2 94%   BMI 39.07 kg/m     Wt Readings from Last 3 Encounters:  07/28/17 227 lb 9.6 oz (103.2 kg)  06/15/15 221 lb (100.2 kg)  12/02/14 225 lb (102.1 kg)     Physical Exam  Constitutional: She is oriented to person, place, and time. She appears well-developed and well-nourished. No distress.  HENT:  Head: Normocephalic and atraumatic.  Eyes: No scleral icterus.  Neck: No JVD present. Carotid bruit is not present.  Cardiovascular: Normal rate and regular rhythm.   No murmur heard. Pulmonary/Chest: Effort normal. She has no wheezes. She has no rales.  Abdominal: Soft. There is no hepatomegaly.  Musculoskeletal: She exhibits edema (trace ankle edema bilaterally ).  Neurological: She is alert and oriented to person, place, and time.  Skin: Skin is warm and dry.  Psychiatric: She has a normal mood and affect.    ASSESSMENT:    1. Shortness of breath   2. Chronic diastolic CHF (congestive heart failure) ([place])   3. Essential hypertension   4. Type 2 diabetes mellitus without complication, without long-term current use of insulin ([place])   5. CKD (chronic kidney disease) stage 3, GFR 30-59 ml/min   6. RBBB (right bundle branch block)   7.  Morbid obesity ([place])    PLAN:    In order of problems listed above:  1. Shortness of breath -  NYHA 2 symptoms that have been fairly stable for some time now.  She likely has multifactorial dyspnea related to age, obesity, diastolic HF.  With her CRFs of diabetes, HTN, age, gender, an anginal equivalent needs to be r/o.  She also has a RBBB on her ECG that is new from 2015.  -  Arrange echocardiogram   -  Arrange Nuclear stress  test   -  FU in 1 month.   2. Chronic diastolic CHF (congestive heart failure) (H[facility]) Volume appears stable.  Continue current rx.  3. Essential hypertension She records her BP at home and notes that it is 120/80s. She admits to "white coat HTN."  Continue to monitor and follow up with Nephrology.  4. Type 2 diabetes mellitus without complication, without long-term current use of insulin ([place]) Continue follow up with PCP.  5. CKD (chronic kidney disease) stage 3, GFR 30-59 ml/min Continue follow up with Nephrology.  I would avoid Cardiac Catheterization unless her Nuclear stress test is high risk.    6. RBBB (right bundle branch block) Appears to be new.  Obtain echocardiogram as noted.  7. Morbid obesity We briefly discussed the importance of weight loss.  If her Nuclear stress test is low risk, she can proceed with an exercise program.   Dispo:  Return in about 1 month (around 08/28/2017) for Follow up after testing, w/ Dr. Acie Fredrickson, or Richardson Dopp, PA-C.   Medication Adjustments/Labs and Tests Ordered: Current medicines are reviewed at length with the patient today.  Concerns regarding medicines are outlined above.  Tests Ordered: Orders Placed This Encounter  Procedures  . Myocardial Perfusion Imaging  . EKG 12-Lead  . ECHOCARDIOGRAM COMPLETE   Medication Changes: No orders of the defined types were placed in this encounter.   Signed, Richardson Dopp, PA-C  07/28/2017 1:38 PM    [facility] [place], [place],    [address] Phone: [phone]; Fax: [phone]

## 2017-07-28 NOTE — Patient Instructions (Signed)
Medication Instructions:  1. Your physician recommends that you continue on your current medications as directed. Please refer to the Current Medication list given to you today.   Labwork: NONE ORDERED TODAY  Testing/Procedures: 1. Your physician has requested that you have an echocardiogram. Echocardiography is a painless test that uses sound waves to create images of your heart. It provides your doctor with information about the size and shape of your heart and how well your heart's chambers and valves are working. This procedure takes approximately one hour. There are no restrictions for this procedure.  2. Your physician has requested that you have a lexiscan myoview. For further information please visit [email or web address]. Please follow instruction sheet, as given.    Follow-Up: SCOTT WEAVER, PAC IN 1 MONTH SAME DAY DR. Acie Fredrickson IS IN THE OFFICE  Any Other Special Instructions Will Be Listed Below (If Applicable).     If you need a refill on your cardiac medications before your next appointment, please call your pharmacy.

## 2017-08-03 ENCOUNTER — Telehealth (HOSPITAL_COMMUNITY): Payer: Self-pay | Admitting: *Deleted

## 2017-08-03 NOTE — Telephone Encounter (Signed)
Patient given detailed instructions per Myocardial Perfusion Study Information Sheet for the test on 08/07/17. Patient notified to arrive 15 minutes early and that it is imperative to arrive on time for appointment to keep from having the test rescheduled.  If you need to cancel or reschedule your appointment, please call the office within 24 hours of your appointment. . Patient verbalized understanding. Jody Oneill

## 2017-08-07 ENCOUNTER — Encounter: Payer: Self-pay | Admitting: Physician Assistant

## 2017-08-07 ENCOUNTER — Ambulatory Visit (HOSPITAL_BASED_OUTPATIENT_CLINIC_OR_DEPARTMENT_OTHER): Payer: Medicare Other

## 2017-08-07 ENCOUNTER — Ambulatory Visit (HOSPITAL_COMMUNITY): Payer: Medicare Other | Attending: Physician Assistant

## 2017-08-07 ENCOUNTER — Other Ambulatory Visit: Payer: Self-pay

## 2017-08-07 DIAGNOSIS — E039 Hypothyroidism, unspecified: Secondary | ICD-10-CM | POA: Insufficient documentation

## 2017-08-07 DIAGNOSIS — Z6839 Body mass index (BMI) 39.0-39.9, adult: Secondary | ICD-10-CM | POA: Insufficient documentation

## 2017-08-07 DIAGNOSIS — G4733 Obstructive sleep apnea (adult) (pediatric): Secondary | ICD-10-CM | POA: Insufficient documentation

## 2017-08-07 DIAGNOSIS — I4510 Unspecified right bundle-branch block: Secondary | ICD-10-CM | POA: Diagnosis not present

## 2017-08-07 DIAGNOSIS — R0602 Shortness of breath: Secondary | ICD-10-CM

## 2017-08-07 DIAGNOSIS — I1310 Hypertensive heart and chronic kidney disease without heart failure, with stage 1 through stage 4 chronic kidney disease, or unspecified chronic kidney disease: Secondary | ICD-10-CM | POA: Insufficient documentation

## 2017-08-07 DIAGNOSIS — N189 Chronic kidney disease, unspecified: Secondary | ICD-10-CM | POA: Insufficient documentation

## 2017-08-07 DIAGNOSIS — E1122 Type 2 diabetes mellitus with diabetic chronic kidney disease: Secondary | ICD-10-CM | POA: Insufficient documentation

## 2017-08-07 DIAGNOSIS — E669 Obesity, unspecified: Secondary | ICD-10-CM | POA: Insufficient documentation

## 2017-08-07 DIAGNOSIS — K760 Fatty (change of) liver, not elsewhere classified: Secondary | ICD-10-CM | POA: Insufficient documentation

## 2017-08-07 MED ORDER — REGADENOSON 0.4 MG/5ML IV SOLN
0.4000 mg | Freq: Once | INTRAVENOUS | Status: AC
  Administered 2017-08-07: 0.4 mg via INTRAVENOUS

## 2017-08-07 MED ORDER — TECHNETIUM TC 99M TETROFOSMIN IV KIT
33.0000 | PACK | Freq: Once | INTRAVENOUS | Status: AC | PRN
  Administered 2017-08-07: 33 via INTRAVENOUS
  Filled 2017-08-07: qty 33

## 2017-08-08 ENCOUNTER — Ambulatory Visit (HOSPITAL_COMMUNITY): Payer: Medicare Other | Attending: Cardiovascular Disease

## 2017-08-08 LAB — MYOCARDIAL PERFUSION IMAGING
LVDIAVOL: 89 mL (ref 46–106)
LVSYSVOL: 31 mL
Peak HR: 76 {beats}/min
RATE: 0.33
Rest HR: 55 {beats}/min
SDS: 2
SRS: 0
SSS: 2
TID: 0.8

## 2017-08-08 MED ORDER — TECHNETIUM TC 99M TETROFOSMIN IV KIT
31.7000 | PACK | Freq: Once | INTRAVENOUS | Status: AC | PRN
  Administered 2017-08-08: 31.7 via INTRAVENOUS
  Filled 2017-08-08: qty 32

## 2017-08-09 ENCOUNTER — Telehealth: Payer: Self-pay | Admitting: Physician Assistant

## 2017-08-09 ENCOUNTER — Encounter: Payer: Self-pay | Admitting: Physician Assistant

## 2017-08-09 NOTE — Telephone Encounter (Signed)
New message ° ° ° ° ° °Returning a call to the nurse to get test results °

## 2017-08-09 NOTE — Telephone Encounter (Signed)
Informed pt of myoview results. Pt verbalized understanding.  

## 2017-08-30 ENCOUNTER — Ambulatory Visit (INDEPENDENT_AMBULATORY_CARE_PROVIDER_SITE_OTHER): Payer: Medicare Other | Admitting: Physician Assistant

## 2017-08-30 ENCOUNTER — Encounter: Payer: Self-pay | Admitting: Physician Assistant

## 2017-08-30 VITALS — BP 142/70 | HR 62 | Ht 64.0 in | Wt 225.8 lb

## 2017-08-30 DIAGNOSIS — I5032 Chronic diastolic (congestive) heart failure: Secondary | ICD-10-CM

## 2017-08-30 DIAGNOSIS — I10 Essential (primary) hypertension: Secondary | ICD-10-CM

## 2017-08-30 DIAGNOSIS — Z23 Encounter for immunization: Secondary | ICD-10-CM | POA: Diagnosis not present

## 2017-08-30 NOTE — Progress Notes (Signed)
Cardiology Office Note:    Date:  08/30/2017   ID:  Jody Oneill, DOB 1946/03/21, MRN [id number]  PCP:  Jody Austin, MD  Cardiologist:  Dr. Liam Oneill   Nephrologist: Dr. Mercy Oneill  Referring MD: Jody Huddle, MD   Chief Complaint  Patient presents with  . Follow-up    shortness of breath    History of Present Illness:    Jody Oneill is a 71 y.o. female with a hx of diastolic HF, OSA, DM2, HTN, CKD.  She was evaluated last month for shortness of breath.  Her symptoms were fairly chronic.  I set her up for a nuclear stress test and echocardiogram. The echocardiogram demonstrated normal LV function and moderate diastolic dysfunction. The nuclear stress test was low risk without ischemia.  Jody Oneill returns for follow up.  She is here alone. She continues to note shortness of breath with certain activities. She denies chest discomfort, PND, edema, syncope.  Prior CV studies:   The following studies were reviewed today:  Nuclear stress test 08/08/17 EF 65, no ischemia or scar, normal study  Echocardiogram 08/07/17 Mild concentric LVH, EF 55-60, normal wall motion, grade 2 diastolic dysfunction, moderate LAE  Echocardiogram 12/05/14 Mild focal basal septal hypertrophy, EF 55-60, normal wall motion, grade 2 diastolic dysfunction, mild LAE, PASP 36  Past Medical History:  Diagnosis Date  . Chronic kidney disease   . Fatty liver   . GERD (gastroesophageal reflux disease)   . Gout   . History of echocardiogram    Echo 8/18: mild conc LVH, EF 55-60, no RWMA, Gr 2 DD, mod LAE  . History of nuclear stress test    Nuclear stress test 8/18: EF 65, no ischemia or scar, normal study  . Hypertension   . Hypothyroid   . Obesity    fatty liver by [place]  . OSA on CPAP   . Osteoarthritis   . Type 2 diabetes mellitus ([place])     Past Surgical History:  Procedure Laterality Date  . BIOPSY BREAST Left    many years ago    Current Medications: Current Meds  Medication Sig  .  allopurinol (ZYLOPRIM) 300 MG tablet Take 300 mg by mouth every other day.   Marland Kitchen aspirin EC 81 MG tablet Take 81 mg by mouth daily.  . clobetasol cream (TEMOVATE) 4.78 % Apply 1 application topically 2 (two) times daily.  . furosemide (LASIX) 20 MG tablet Take 20 mg by mouth 2 (two) times daily.   Marland Kitchen GLIPIZIDE XL 10 MG 24 hr tablet Take 1 tablet by mouth daily.  . hydrALAZINE (APRESOLINE) 50 MG tablet Take 100 mg by mouth 2 (two) times daily.   . lansoprazole (PREVACID) 30 MG capsule Take 30 mg by mouth daily.  Marland Kitchen levothyroxine (SYNTHROID, LEVOTHROID) 88 MCG tablet Take 88 mcg by mouth daily before breakfast.  . lisinopril (PRINIVIL,ZESTRIL) 40 MG tablet Take 40 mg by mouth daily.  . metoprolol (TOPROL-XL) 200 MG 24 hr tablet Take 200 mg by mouth daily.  . Multiple Vitamin (MULTIVITAMIN) tablet Take 2 tablets by mouth daily.   . pravastatin (PRAVACHOL) 20 MG tablet Take 20 mg by mouth daily.     Allergies:   Amlodipine besylate and Hydrochlorothiazide   Social History   Social History  . Marital status: Widowed    Spouse name: N/A  . Number of children: N/A  . Years of education: N/A   Social History Main Topics  . Smoking status: Never Smoker  . Smokeless tobacco:  Never Used  . Alcohol use None  . Drug use: Unknown  . Sexual activity: Not Asked   Other Topics Concern  . None   Social History Narrative   Tobacco use  Cigarettes: Never smoked ,Tobacco history updated 10/23/2014   Alcohol  : yes occasionally . Caffeine  Yes, rare no recreational drugs .   Exercise Yes   -three times a week water aerobics   Occupation unemployed /retired   Martial status single widowed (2006)    2 daughters    Seat belt use - yes           Family Hx: The patient's family history includes Diabetes in her father; Lung cancer in her mother; Other in her father.  ROS:   Please see the history of present illness.    Review of Systems  Respiratory: Positive for cough.   Musculoskeletal:  Positive for back pain.  Neurological: Positive for headaches.   All other systems reviewed and are negative.   EKGs/Labs/Other Test Reviewed:    EKG:  EKG is Not ordered today.  The ekg ordered today demonstrates n/a  Recent Labs: No results found for requested labs within last 8760 hours.   Recent Lipid Panel No results found for: CHOL, TRIG, HDL, CHOLHDL, LDLCALC, LDLDIRECT  Physical Exam:    VS:  BP (!) 142/70   Pulse 62   Ht 5\' 4"  (1.626 m)   Wt 225 lb 12.8 oz (102.4 kg)   SpO2 97%   BMI 38.76 kg/m     Wt Readings from Last 3 Encounters:  08/30/17 225 lb 12.8 oz (102.4 kg)  07/28/17 227 lb 9.6 oz (103.2 kg)  06/15/15 221 lb (100.2 kg)     Physical Exam  Constitutional: She is oriented to person, place, and time. She appears well-developed and well-nourished. No distress.  HENT:  Head: Normocephalic and atraumatic.  Eyes: No scleral icterus.  Neck: Normal range of motion. JVD: I cannot appreciate JVD.  Cardiovascular: Normal rate, regular rhythm, S1 normal, S2 normal and normal heart sounds.   No murmur heard. Pulmonary/Chest: Breath sounds normal. She has no wheezes. She has no rhonchi. She has no rales.  Abdominal: Soft. There is no tenderness.  Musculoskeletal: She exhibits no edema.  Neurological: She is alert and oriented to person, place, and time.  Skin: Skin is warm and dry.  Psychiatric: She has a normal mood and affect.    ASSESSMENT:    1. Chronic diastolic CHF (congestive heart failure) ([place])   2. Essential hypertension    PLAN:    In order of problems listed above:  1. Chronic diastolic CHF (congestive heart failure) (HCC)  Recent echocardiogram and stress test obtained for shortness of breath was largely unremarkable. She does have diastolic dysfunction. She does not appear volume overloaded on exam. She already takes furosemide 20 mg twice a day. I will obtain a BNP today. If her BNP is elevated, I will adjust her Lasix further.  If her BNP  is normal, continue current therapy. Consideration could be given towards proceeding with PFT's if she continues to have trouble with dyspnea. This can be arranged with her primary care doctor.  2. Essential hypertension Borderline control. Continue to monitor. Continue current therapy.   Dispo:  Return in about 6 months (around 02/27/2018) for Routine Follow Up, w/ Dr. Acie Fredrickson, or Richardson Dopp, PA-C.   Medication Adjustments/Labs and Tests Ordered: Current medicines are reviewed at length with the patient today.  Concerns regarding medicines are  outlined above.  Tests Ordered: Orders Placed This Encounter  Procedures  . Pro b natriuretic peptide   Medication Changes: No orders of the defined types were placed in this encounter.   Signed, Richardson Dopp, PA-C  08/30/2017 5:19 PM    [facility] [address], [place], [place]  [address] Phone: [phone]; Fax: [phone]

## 2017-08-30 NOTE — Patient Instructions (Signed)
Medication Instructions:  Your physician recommends that you continue on your current medications as directed. Please refer to the Current Medication list given to you today.   Labwork: TODAY PRO BNP  Testing/Procedures: NONE ORDERED TODAY  Follow-Up: Your physician wants you to follow-up in: [place] DR. Acie Fredrickson You will receive a reminder letter in the mail two months in advance. If you don't receive a letter, please call our office to schedule the follow-up appointment.   Any Other Special Instructions Will Be Listed Below (If Applicable).     If you need a refill on your cardiac medications before your next appointment, please call your pharmacy.

## 2017-08-31 ENCOUNTER — Telehealth: Payer: Self-pay | Admitting: *Deleted

## 2017-08-31 LAB — PRO B NATRIURETIC PEPTIDE: NT-Pro BNP: 173 pg/mL (ref 0–301)

## 2017-08-31 NOTE — Telephone Encounter (Signed)
-----   Message from Liliane Shi, [place] sent at 08/31/2017 10:07 AM EDT ----- Please call the patient BNP is normal. Continue current medications and follow up as planned.  Richardson Dopp, PA-C    08/31/2017 10:07 AM

## 2017-08-31 NOTE — Telephone Encounter (Signed)
Pt has been notified of lab results by phone with verbal understanding. Pt thanked me for my call today.   

## 2017-08-31 NOTE — Telephone Encounter (Signed)
Left message to go over lab results.  

## 2017-08-31 NOTE — Telephone Encounter (Signed)
F/u Message ° °Pt returning RN call. Please call back to discuss  °

## 2017-11-17 ENCOUNTER — Other Ambulatory Visit: Payer: Self-pay | Admitting: Obstetrics and Gynecology

## 2017-11-17 DIAGNOSIS — Z1231 Encounter for screening mammogram for malignant neoplasm of breast: Secondary | ICD-10-CM

## 2017-12-23 DIAGNOSIS — I130 Hypertensive heart and chronic kidney disease with heart failure and stage 1 through stage 4 chronic kidney disease, or unspecified chronic kidney disease: Secondary | ICD-10-CM | POA: Diagnosis not present

## 2017-12-23 DIAGNOSIS — N183 Chronic kidney disease, stage 3 (moderate): Secondary | ICD-10-CM | POA: Insufficient documentation

## 2017-12-23 DIAGNOSIS — Z7982 Long term (current) use of aspirin: Secondary | ICD-10-CM | POA: Diagnosis not present

## 2017-12-23 DIAGNOSIS — E1122 Type 2 diabetes mellitus with diabetic chronic kidney disease: Secondary | ICD-10-CM | POA: Insufficient documentation

## 2017-12-23 DIAGNOSIS — M79604 Pain in right leg: Secondary | ICD-10-CM | POA: Diagnosis not present

## 2017-12-23 DIAGNOSIS — I10 Essential (primary) hypertension: Secondary | ICD-10-CM | POA: Diagnosis not present

## 2017-12-23 DIAGNOSIS — Z79899 Other long term (current) drug therapy: Secondary | ICD-10-CM | POA: Diagnosis not present

## 2017-12-23 DIAGNOSIS — I5032 Chronic diastolic (congestive) heart failure: Secondary | ICD-10-CM | POA: Diagnosis not present

## 2017-12-23 DIAGNOSIS — Z7984 Long term (current) use of oral hypoglycemic drugs: Secondary | ICD-10-CM | POA: Insufficient documentation

## 2017-12-24 ENCOUNTER — Other Ambulatory Visit: Payer: Self-pay

## 2017-12-24 ENCOUNTER — Ambulatory Visit (HOSPITAL_BASED_OUTPATIENT_CLINIC_OR_DEPARTMENT_OTHER)
Admission: RE | Admit: 2017-12-24 | Discharge: 2017-12-24 | Disposition: A | Discharge status: Home / Self Care | Payer: Medicare Other | Source: Ambulatory Visit | Attending: Emergency Medicine | Admitting: Emergency Medicine

## 2017-12-24 ENCOUNTER — Encounter (HOSPITAL_COMMUNITY): Payer: Self-pay | Admitting: Emergency Medicine

## 2017-12-24 ENCOUNTER — Emergency Department (HOSPITAL_COMMUNITY)
Admission: EM | Admit: 2017-12-24 | Discharge: 2017-12-24 | Disposition: A | Discharge status: Home / Self Care | Payer: Medicare Other | Attending: Emergency Medicine | Admitting: Emergency Medicine

## 2017-12-24 DIAGNOSIS — M79604 Pain in right leg: Secondary | ICD-10-CM

## 2017-12-24 DIAGNOSIS — M79609 Pain in unspecified limb: Secondary | ICD-10-CM

## 2017-12-24 DIAGNOSIS — I10 Essential (primary) hypertension: Secondary | ICD-10-CM

## 2017-12-24 LAB — CBC WITH DIFFERENTIAL/PLATELET
BASOS ABS: 0 10*3/uL (ref 0.0–0.1)
BASOS PCT: 1 %
Eosinophils Absolute: 0.2 10*3/uL (ref 0.0–0.7)
Eosinophils Relative: 4 %
HEMATOCRIT: 35 % — AB (ref 36.0–46.0)
HEMOGLOBIN: 12 g/dL (ref 12.0–15.0)
LYMPHS PCT: 33 %
Lymphs Abs: 1.7 10*3/uL (ref 0.7–4.0)
MCH: 29.1 pg (ref 26.0–34.0)
MCHC: 34.3 g/dL (ref 30.0–36.0)
MCV: 85 fL (ref 78.0–100.0)
Monocytes Absolute: 0.5 10*3/uL (ref 0.1–1.0)
Monocytes Relative: 10 %
NEUTROS ABS: 2.7 10*3/uL (ref 1.7–7.7)
NEUTROS PCT: 52 %
Platelets: 172 10*3/uL (ref 150–400)
RBC: 4.12 MIL/uL (ref 3.87–5.11)
RDW: 14.5 % (ref 11.5–15.5)
WBC: 5.2 10*3/uL (ref 4.0–10.5)

## 2017-12-24 LAB — D-DIMER, QUANTITATIVE: D-Dimer, Quant: 0.75 ug/mL-FEU — ABNORMAL HIGH (ref 0.00–0.50)

## 2017-12-24 LAB — BASIC METABOLIC PANEL
ANION GAP: 8 (ref 5–15)
BUN: 25 mg/dL — ABNORMAL HIGH (ref 6–20)
CALCIUM: 9.7 mg/dL (ref 8.9–10.3)
CO2: 27 mmol/L (ref 22–32)
Chloride: 103 mmol/L (ref 101–111)
Creatinine, Ser: 1.36 mg/dL — ABNORMAL HIGH (ref 0.44–1.00)
GFR, EST AFRICAN AMERICAN: 44 mL/min — AB (ref >60)
GFR, EST NON AFRICAN AMERICAN: 38 mL/min — AB (ref >60)
Glucose, Bld: 135 mg/dL — ABNORMAL HIGH (ref 65–99)
POTASSIUM: 3.6 mmol/L (ref 3.5–5.1)
Sodium: 138 mmol/L (ref 135–145)

## 2017-12-24 MED ORDER — RIVAROXABAN 15 MG PO TABS
15.0000 mg | ORAL_TABLET | Freq: Once | ORAL | Status: AC
  Administered 2017-12-24: 15 mg via ORAL
  Filled 2017-12-24: qty 1

## 2017-12-24 MED ORDER — HYDROCODONE-ACETAMINOPHEN 5-325 MG PO TABS
1.0000 | ORAL_TABLET | Freq: Once | ORAL | Status: AC
  Administered 2017-12-24: 1 via ORAL
  Filled 2017-12-24: qty 1

## 2017-12-24 NOTE — Progress Notes (Signed)
Right lower extremity venous duplex has been completed. Negative for DVT.  12/24/17 8:42 AM Jody Oneill RVT

## 2017-12-24 NOTE — ED Provider Notes (Signed)
[facility] DEPT Provider Note   CSN: [id number] Arrival date & time: 12/23/17  2352     History   Chief Complaint Chief Complaint  Patient presents with  . Leg Pain  . Hypertension    HPI Jody Oneill is a 72 y.o. female.  The history is provided by the patient.  Leg Pain   This is a new problem. The current episode started 2 days ago. The problem occurs constantly. The problem has been gradually worsening. The pain is present in the right upper leg and right lower leg. Quality: Cramping. The pain is moderate. Associated symptoms include limited range of motion and stiffness. She has tried nothing for the symptoms.  Hypertension  Pertinent negatives include no chest pain and no shortness of breath.  Patient reports over the past 1-2 days she began having cramping in her right posterior thigh and right calf She also reports some pain in her back that radiates into her thigh. No fever, no vomiting, no chest pain, no shortness of breath, no abdominal pain. No Focal weakness is reported, but she did have intermittent tingling in her right foot She is able to ambulate No urinary symptoms reported No history of DVT, no travel, no recent surgery Past Medical History:  Diagnosis Date  . Chronic kidney disease   . Fatty liver   . GERD (gastroesophageal reflux disease)   . Gout   . History of echocardiogram    Echo 8/18: mild conc LVH, EF 55-60, no RWMA, Gr 2 DD, mod LAE  . History of nuclear stress test    Nuclear stress test 8/18: EF 65, no ischemia or scar, normal study  . Hypertension   . Hypothyroid   . Obesity    fatty liver by [place]  . OSA on CPAP   . Osteoarthritis   . Type 2 diabetes mellitus [facility])     Patient Active Problem List   Diagnosis Date Noted  . Essential hypertension 07/28/2017  . Type 2 diabetes mellitus without complications ([place]) 18/84/1660  . CKD (chronic kidney disease) stage 3, GFR 30-59 ml/min (HCC) 07/28/2017  .  RBBB (right bundle branch block) 07/28/2017  . Chronic diastolic CHF (congestive heart failure) ([place]) 06/15/2015  . DOE (dyspnea on exertion) 12/02/2014    Past Surgical History:  Procedure Laterality Date  . BIOPSY BREAST Left    many years ago    OB History    No data available       Home Medications    Prior to Admission medications   Medication Sig Start Date End Date Taking? Authorizing Provider  allopurinol (ZYLOPRIM) 300 MG tablet Take 300 mg by mouth every other day.     [provider]  aspirin EC 81 MG tablet Take 81 mg by mouth daily.    [provider]  clobetasol cream (TEMOVATE) 6.30 % Apply 1 application topically 2 (two) times daily.    [provider]  furosemide (LASIX) 20 MG tablet Take 20 mg by mouth 2 (two) times daily.     [provider]  GLIPIZIDE XL 10 MG 24 hr tablet Take 1 tablet by mouth daily. 06/01/15   [provider]  hydrALAZINE (APRESOLINE) 50 MG tablet Take 100 mg by mouth 2 (two) times daily.     [provider]  lansoprazole (PREVACID) 30 MG capsule Take 30 mg by mouth daily. 11/18/14   [provider]  levothyroxine (SYNTHROID, LEVOTHROID) 88 MCG tablet Take 88 mcg by mouth  daily before breakfast.    [provider]  lisinopril (PRINIVIL,ZESTRIL) 40 MG tablet Take 40 mg by mouth daily.    [provider]  metoprolol (TOPROL-XL) 200 MG 24 hr tablet Take 200 mg by mouth daily.    [provider]  Multiple Vitamin (MULTIVITAMIN) tablet Take 2 tablets by mouth daily.     [provider]  pravastatin (PRAVACHOL) 20 MG tablet Take 20 mg by mouth daily.    [provider]    Family History Family History  Problem Relation Age of Onset  . Lung cancer Mother   . Other Father        meningitis  . Diabetes Father     Social History Social History   Tobacco Use  . Smoking status: Never Smoker  . Smokeless tobacco: Never Used  Substance  Use Topics  . Alcohol use: No    Alcohol/week: 0.0 oz    Frequency: Never  . Drug use: No     Allergies   Amlodipine besylate and Hydrochlorothiazide   Review of Systems Review of Systems  Constitutional: Negative for fever.  Respiratory: Negative for shortness of breath.   Cardiovascular: Negative for chest pain.  Gastrointestinal: Negative for blood in stool and vomiting.  Musculoskeletal: Positive for arthralgias, back pain and stiffness.  All other systems reviewed and are negative.    Physical Exam Updated Vital Signs BP (!) 149/96   Pulse (!) 59   Temp 97.8 F (36.6 C) (Oral)   Resp 18   Ht 1.626 m (5\' 4" )   Wt 102.5 kg (226 lb)   SpO2 96%   BMI 38.79 kg/m   Physical Exam  CONSTITUTIONAL: Well developed/well nourished HEAD: Normocephalic/atraumatic EYES: EOMI ENMT: Mucous membranes moist NECK: supple no meningeal signs SPINE/BACK:entire spine nontender CV: S1/S2 noted, no murmurs/rubs/gallops noted LUNGS: Lungs are clear to auscultation bilaterally, no apparent distress ABDOMEN: soft, nontender NEURO: Pt is awake/alert/appropriate, moves all extremitiesx4.  No facial droop.  No focal motor deficits in the lower extremities EXTREMITIES: pulses normal/equal, full ROM, distal pulses intact.  The legs are symmetric.  Mild tenderness to posterior thigh/right calf, but no erythema, no abscess, no crepitus.  She has full range of motion of right hip, right knee, right ankle SKIN: warm, color normal PSYCH: no abnormalities of mood noted, alert and oriented to situation  ED Treatments / Results  Labs (all labs ordered are listed, but only abnormal results are displayed) Labs Reviewed  BASIC METABOLIC PANEL - Abnormal; Notable for the following components:      Result Value   Glucose, Bld 135 (*)    BUN 25 (*)    Creatinine, Ser 1.36 (*)    GFR calc non Af Amer 38 (*)    GFR calc Af Amer 44 (*)    All other components within normal limits  CBC WITH  DIFFERENTIAL/PLATELET - Abnormal; Notable for the following components:   HCT 35.0 (*)    All other components within normal limits  D-DIMER, QUANTITATIVE (NOT AT [facility]) - Abnormal; Notable for the following components:   D-Dimer, Quant 0.75 (*)    All other components within normal limits    EKG  EKG Interpretation None       Radiology No results found.  Procedures Procedures (including critical care time)  Medications Ordered in ED Medications  HYDROcodone-acetaminophen (NORCO/VICODIN) 5-325 MG per tablet 1 tablet (1 tablet Oral Given 12/24/17 0313)  Rivaroxaban (XARELTO) tablet 15 mg (15 mg Oral Given 12/24/17 0426)  HYDROcodone-acetaminophen (NORCO/VICODIN) 5-325 MG per tablet 1 tablet (1 tablet Oral Given 12/24/17 0426)     Initial Impression / Assessment and Plan / ED Course  I have reviewed the triage vital signs and the nursing notes.  Pertinent labs  results that were available during my care of the patient were reviewed by me and considered in my medical decision making (see chart for details).     D-dimer was elevated, she will need formal ultrasound of her right leg to rule out a DVT. This is unavailable at this time, she will go to [facility] later this morning for formal ultrasound One-time dose of Xarelto given just in case this is an early DVT Otherwise patient is awake alert, no distress she has no other complaints  Final Clinical Impressions(s) / ED Diagnoses   Final diagnoses:  Essential hypertension  Right leg pain    ED Discharge Orders        Ordered    LE VENOUS     12/24/17 0348       Ripley Fraise, MD 12/24/17 [phone]

## 2017-12-24 NOTE — ED Triage Notes (Signed)
Pt reports having pain right lower leg with cramping pain in posterior leg on Friday night and has proceeded today. Pt also reports having elevated blood pressure after prescribed medication and pain with rest.

## 2017-12-25 ENCOUNTER — Ambulatory Visit: Payer: Medicare Other

## 2017-12-28 DIAGNOSIS — I10 Essential (primary) hypertension: Secondary | ICD-10-CM | POA: Diagnosis not present

## 2017-12-28 DIAGNOSIS — M79604 Pain in right leg: Secondary | ICD-10-CM | POA: Diagnosis not present

## 2017-12-28 DIAGNOSIS — M791 Myalgia, unspecified site: Secondary | ICD-10-CM | POA: Diagnosis not present

## 2018-01-03 DIAGNOSIS — M5431 Sciatica, right side: Secondary | ICD-10-CM | POA: Diagnosis not present

## 2018-01-15 DIAGNOSIS — E039 Hypothyroidism, unspecified: Secondary | ICD-10-CM | POA: Diagnosis not present

## 2018-01-15 DIAGNOSIS — I10 Essential (primary) hypertension: Secondary | ICD-10-CM | POA: Diagnosis not present

## 2018-01-15 DIAGNOSIS — E7800 Pure hypercholesterolemia, unspecified: Secondary | ICD-10-CM | POA: Diagnosis not present

## 2018-01-15 DIAGNOSIS — M1000 Idiopathic gout, unspecified site: Secondary | ICD-10-CM | POA: Diagnosis not present

## 2018-01-15 DIAGNOSIS — E1122 Type 2 diabetes mellitus with diabetic chronic kidney disease: Secondary | ICD-10-CM | POA: Diagnosis not present

## 2018-01-17 ENCOUNTER — Ambulatory Visit
Admission: RE | Admit: 2018-01-17 | Discharge: 2018-01-17 | Disposition: A | Discharge status: Home / Self Care | Payer: Medicare Other | Source: Ambulatory Visit | Attending: Obstetrics and Gynecology | Admitting: Obstetrics and Gynecology

## 2018-01-17 DIAGNOSIS — Z1231 Encounter for screening mammogram for malignant neoplasm of breast: Secondary | ICD-10-CM | POA: Diagnosis not present

## 2018-01-18 ENCOUNTER — Other Ambulatory Visit: Payer: Self-pay | Admitting: Obstetrics and Gynecology

## 2018-01-18 DIAGNOSIS — R928 Other abnormal and inconclusive findings on diagnostic imaging of breast: Secondary | ICD-10-CM

## 2018-01-24 ENCOUNTER — Ambulatory Visit
Admission: RE | Admit: 2018-01-24 | Discharge: 2018-01-24 | Disposition: A | Discharge status: Home / Self Care | Payer: Medicare Other | Source: Ambulatory Visit | Attending: Obstetrics and Gynecology | Admitting: Obstetrics and Gynecology

## 2018-01-24 ENCOUNTER — Other Ambulatory Visit: Payer: Self-pay | Admitting: Family Medicine

## 2018-01-24 ENCOUNTER — Other Ambulatory Visit: Payer: Self-pay | Admitting: Obstetrics and Gynecology

## 2018-01-24 DIAGNOSIS — R928 Other abnormal and inconclusive findings on diagnostic imaging of breast: Secondary | ICD-10-CM

## 2018-01-24 DIAGNOSIS — R921 Mammographic calcification found on diagnostic imaging of breast: Secondary | ICD-10-CM | POA: Diagnosis not present

## 2018-01-30 DIAGNOSIS — H35033 Hypertensive retinopathy, bilateral: Secondary | ICD-10-CM | POA: Diagnosis not present

## 2018-01-30 DIAGNOSIS — H353132 Nonexudative age-related macular degeneration, bilateral, intermediate dry stage: Secondary | ICD-10-CM | POA: Diagnosis not present

## 2018-01-31 DIAGNOSIS — M5431 Sciatica, right side: Secondary | ICD-10-CM | POA: Diagnosis not present

## 2018-02-02 DIAGNOSIS — E1122 Type 2 diabetes mellitus with diabetic chronic kidney disease: Secondary | ICD-10-CM | POA: Diagnosis not present

## 2018-02-02 DIAGNOSIS — I129 Hypertensive chronic kidney disease with stage 1 through stage 4 chronic kidney disease, or unspecified chronic kidney disease: Secondary | ICD-10-CM | POA: Diagnosis not present

## 2018-02-02 DIAGNOSIS — N183 Chronic kidney disease, stage 3 (moderate): Secondary | ICD-10-CM | POA: Diagnosis not present

## 2018-02-02 DIAGNOSIS — N2581 Secondary hyperparathyroidism of renal origin: Secondary | ICD-10-CM | POA: Diagnosis not present

## 2018-02-05 DIAGNOSIS — G4733 Obstructive sleep apnea (adult) (pediatric): Secondary | ICD-10-CM | POA: Diagnosis not present

## 2018-03-28 ENCOUNTER — Encounter: Payer: Self-pay | Admitting: Cardiovascular Disease

## 2018-04-02 ENCOUNTER — Encounter: Payer: Self-pay | Admitting: Cardiovascular Disease

## 2018-04-02 ENCOUNTER — Ambulatory Visit (INDEPENDENT_AMBULATORY_CARE_PROVIDER_SITE_OTHER): Payer: Medicare Other | Admitting: Cardiovascular Disease

## 2018-04-02 VITALS — BP 182/74 | HR 64 | Ht 64.0 in | Wt 224.8 lb

## 2018-04-02 DIAGNOSIS — I5032 Chronic diastolic (congestive) heart failure: Secondary | ICD-10-CM | POA: Diagnosis not present

## 2018-04-02 DIAGNOSIS — I10 Essential (primary) hypertension: Secondary | ICD-10-CM | POA: Diagnosis not present

## 2018-04-02 MED ORDER — POTASSIUM CHLORIDE ER 10 MEQ PO TBCR: 10.0000 meq | EXTENDED_RELEASE_TABLET | Freq: Two times a day (BID) | ORAL | 3 refills | Status: DC

## 2018-04-02 MED ORDER — HYDRALAZINE HCL 50 MG PO TABS: 50.0000 mg | ORAL_TABLET | Freq: Three times a day (TID) | ORAL | 3 refills | Status: DC

## 2018-04-02 NOTE — Progress Notes (Signed)
Cardiology Office Note:    Date:  04/02/2018   ID:  Jody Oneill, DOB 05/01/1946, MRN [id number]  PCP:  Jody Austin, MD  Cardiologist:  Jody Oneill   Referring MD: Jody Austin, MD   Chief Complaint  Patient presents with  . Congestive Heart Failure  . Hypertension      April 02, 2018     Jody Oneill is a 72 y.o. female with a hx of chronic diastolic congestive heart failure, obstructive sleep apnea, type 2 diabetes mellitus, hypertension, chronic kidney disease.  She was last seen in our office in September, 2018 by Jody Dopp, PA.  She has had significant shortness of breath.  Echocardiogram performed in 2018 reveals normal left ventricular systolic function with moderate diastolic dysfunction.  Nuclear stress test was low risk without ischemia.  BP is high this am.  Has been eating out more recently  Keeps a record of her BP .  Typical BP is 145-150  Goes to the [facility] - does silver sneakers and does water aerobics  No CP or dyspnea  - has some DOE when climbing steps .      Past Medical History:  Diagnosis Date  . Chronic kidney disease   . Fatty liver   . GERD (gastroesophageal reflux disease)   . Gout   . History of echocardiogram    Echo 8/18: mild conc LVH, EF 55-60, no RWMA, Gr 2 DD, mod LAE  . History of nuclear stress test    Nuclear stress test 8/18: EF 65, no ischemia or scar, normal study  . Hypertension   . Hypothyroid   . Obesity    fatty liver by [place]  . OSA on CPAP   . Osteoarthritis   . Type 2 diabetes mellitus ([place])     Past Surgical History:  Procedure Laterality Date  . BIOPSY BREAST Left    many years ago    Current Medications: Current Meds  Medication Sig  . acetaminophen (TYLENOL) 325 MG tablet Take 650 mg by mouth every 6 (six) hours as needed for mild pain, moderate pain or headache.  . allopurinol (ZYLOPRIM) 300 MG tablet Take 300 mg by mouth every other day.   Marland Kitchen aspirin EC 81 MG tablet Take 81 mg by mouth daily.  . clobetasol  cream (TEMOVATE) 8.93 % Apply 1 application topically 2 (two) times daily.  . furosemide (LASIX) 20 MG tablet Take 20 mg by mouth 2 (two) times daily.   Marland Kitchen GLIPIZIDE XL 10 MG 24 hr tablet Take 10 mg by mouth daily.   . lansoprazole (PREVACID) 30 MG capsule Take 30 mg by mouth daily.  Marland Kitchen levothyroxine (SYNTHROID, LEVOTHROID) 88 MCG tablet Take 88 mcg by mouth daily before breakfast.  . lisinopril (PRINIVIL,ZESTRIL) 40 MG tablet Take 40 mg by mouth daily.  . metoprolol (TOPROL-XL) 200 MG 24 hr tablet Take 200 mg by mouth daily.  . Multiple Vitamin (MULTIVITAMIN) tablet Take 2 tablets by mouth daily.   . pravastatin (PRAVACHOL) 20 MG tablet Take 20 mg by mouth daily.  . [DISCONTINUED] hydrALAZINE (APRESOLINE) 50 MG tablet Take 50 mg by mouth 2 (two) times daily.      Allergies:   Amlodipine besylate and Hydrochlorothiazide   Social History   Socioeconomic History  . Marital status: Widowed    Spouse name: Not on file  . Number of children: Not on file  . Years of education: Not on file  . Highest education level: Not on file  Occupational History  .  Not on file  Social Needs  . Financial resource strain: Not on file  . Food insecurity:    Worry: Not on file    Inability: Not on file  . Transportation needs:    Medical: Not on file    Non-medical: Not on file  Tobacco Use  . Smoking status: Never Smoker  . Smokeless tobacco: Never Used  Substance and Sexual Activity  . Alcohol use: No    Alcohol/week: 0.0 oz    Frequency: Never  . Drug use: No  . Sexual activity: Not on file  Lifestyle  . Physical activity:    Days per week: Not on file    Minutes per session: Not on file  . Stress: Not on file  Relationships  . Social connections:    Talks on phone: Not on file    Gets together: Not on file    Attends religious service: Not on file    Active member of club or organization: Not on file    Attends meetings of clubs or organizations: Not on file    Relationship status:  Not on file  Other Topics Concern  . Not on file  Social History Narrative   Tobacco use  Cigarettes: Never smoked ,Tobacco history updated 10/23/2014   Alcohol  : yes occasionally . Caffeine  Yes, rare no recreational drugs .   Exercise Yes   -three times a week water aerobics   Occupation unemployed /retired   Martial status single widowed (2006)    2 daughters    Seat belt use - yes        Family History: The patient's family history includes Breast cancer in her maternal aunt and maternal aunt; Diabetes in her father; Lung cancer in her mother; Other in her father.  ROS:   Please see the history of present illness.     All other systems reviewed and are negative.  EKGs/Labs/Other Studies Reviewed:    The following studies were reviewed today:   EKG:    Recent Labs: 08/30/2017: NT-Pro BNP 173 12/24/2017: BUN 25; Creatinine, Ser 1.36; Hemoglobin 12.0; Platelets 172; Potassium 3.6; Sodium 138  Recent Lipid Panel No results found for: CHOL, TRIG, HDL, CHOLHDL, VLDL, LDLCALC, LDLDIRECT  Physical Exam:    VS:  BP (!) 182/74   Pulse 64   Ht 5\' 4"  (1.626 m)   Wt 224 lb 12.8 oz (102 kg)   SpO2 97%   BMI 38.59 kg/m     Wt Readings from Last 3 Encounters:  04/02/18 224 lb 12.8 oz (102 kg)  12/24/17 226 lb (102.5 kg)  08/30/17 225 lb 12.8 oz (102.4 kg)     GEN:   Elderly female,  Morbidly obese  ,  NAD  HEENT: Normal NECK: No JVD; No carotid bruits LYMPHATICS: No lymphadenopathy CARDIAC: RRR, no murmurs, rubs, gallops RESPIRATORY:  Clear to auscultation without rales, wheezing or rhonchi  ABDOMEN: Soft, non-tender, non-distended MUSCULOSKELETAL:  No edema; No deformity  SKIN: Warm and dry NEUROLOGIC:  Alert and oriented x 3 PSYCHIATRIC:  Normal affect   ASSESSMENT:    1. Essential hypertension   2. Chronic diastolic CHF (congestive heart failure) (HCC)    PLAN:    In order of problems listed above:  1. Chronic diastolic congestive heart failure Seems  stable ,  Has DOE which is fairly chronic.   2.  Essential hypertension -strongly advised her to lose weight.  She needs to stop eating out so much.  She needs  to avoid eating salt. We will increase her hydralazine to 50 mg 3 times a day.  She is on Lasix 20 mg twice a day and diuresis fairly well with that dose of Lasix.  We will add potassium chloride 10 mEq twice a day.  We will check a basic metabolic profile in 2 weeks.  Return to see Jody Dopp, PA in 6 months.  Medication Adjustments/Labs and Tests Ordered: Current medicines are reviewed at length with the patient today.  Concerns regarding medicines are outlined above.  Orders Placed This Encounter  Procedures  . Basic Metabolic Panel (BMET)   Meds ordered this encounter  Medications  . hydrALAZINE (APRESOLINE) 50 MG tablet    Sig: Take 1 tablet (50 mg total) by mouth 3 (three) times daily.    Dispense:  270 tablet    Refill:  3  . potassium chloride (K-DUR) 10 MEQ tablet    Sig: Take 1 tablet (10 mEq total) by mouth 2 (two) times daily.    Dispense:  180 tablet    Refill:  3    Signed, Mertie Moores, MD  04/02/2018 5:23 PM    [place]

## 2018-04-02 NOTE — Patient Instructions (Signed)
Medication Instructions:  Your physician has recommended you make the following change in your medication:  START Kdur (potassium chloride) 10 mEq twice daily INCREASE Hydralazine (Apresoline) to 50 mg 3 times per day   Labwork: Your physician recommends that you return for lab work in: 2 weeks for basic metabolic panel   Testing/Procedures: None Ordered   Follow-Up: Your physician wants you to follow-up in: 6 months with Richardson Dopp, PA or another member of Dr. Elmarie Shiley team. Dennis Bast will receive a reminder letter in the mail two months in advance. If you don't receive a letter, please call our office to schedule the follow-up appointment.   If you need a refill on your cardiac medications before your next appointment, please call your pharmacy.   Thank you for choosing [facility]! Christen Bame, RN [phone]

## 2018-04-17 ENCOUNTER — Other Ambulatory Visit: Payer: Medicare Other

## 2018-04-17 DIAGNOSIS — I10 Essential (primary) hypertension: Secondary | ICD-10-CM | POA: Diagnosis not present

## 2018-04-17 DIAGNOSIS — I5032 Chronic diastolic (congestive) heart failure: Secondary | ICD-10-CM

## 2018-04-17 LAB — BASIC METABOLIC PANEL
BUN / CREAT RATIO: 20 (ref 12–28)
BUN: 25 mg/dL (ref 8–27)
CO2: 23 mmol/L (ref 20–29)
CREATININE: 1.22 mg/dL — AB (ref 0.57–1.00)
Calcium: 9.1 mg/dL (ref 8.7–10.3)
Chloride: 101 mmol/L (ref 96–106)
GFR calc Af Amer: 51 mL/min/{1.73_m2} — ABNORMAL LOW (ref >59)
GFR calc non Af Amer: 44 mL/min/{1.73_m2} — ABNORMAL LOW (ref >59)
Glucose: 271 mg/dL — ABNORMAL HIGH (ref 65–99)
Potassium: 4.1 mmol/L (ref 3.5–5.2)
SODIUM: 141 mmol/L (ref 134–144)

## 2018-07-18 DIAGNOSIS — I10 Essential (primary) hypertension: Secondary | ICD-10-CM | POA: Diagnosis not present

## 2018-07-18 DIAGNOSIS — N183 Chronic kidney disease, stage 3 (moderate): Secondary | ICD-10-CM | POA: Diagnosis not present

## 2018-07-18 DIAGNOSIS — I5032 Chronic diastolic (congestive) heart failure: Secondary | ICD-10-CM | POA: Diagnosis not present

## 2018-07-18 DIAGNOSIS — E039 Hypothyroidism, unspecified: Secondary | ICD-10-CM | POA: Diagnosis not present

## 2018-07-18 DIAGNOSIS — E1122 Type 2 diabetes mellitus with diabetic chronic kidney disease: Secondary | ICD-10-CM | POA: Diagnosis not present

## 2018-07-18 DIAGNOSIS — Z0000 Encounter for general adult medical examination without abnormal findings: Secondary | ICD-10-CM | POA: Diagnosis not present

## 2018-07-18 DIAGNOSIS — M1000 Idiopathic gout, unspecified site: Secondary | ICD-10-CM | POA: Diagnosis not present

## 2018-07-18 DIAGNOSIS — K219 Gastro-esophageal reflux disease without esophagitis: Secondary | ICD-10-CM | POA: Diagnosis not present

## 2018-07-18 DIAGNOSIS — E7800 Pure hypercholesterolemia, unspecified: Secondary | ICD-10-CM | POA: Diagnosis not present

## 2018-07-25 ENCOUNTER — Ambulatory Visit
Admission: RE | Admit: 2018-07-25 | Discharge: 2018-07-25 | Disposition: A | Discharge status: Home / Self Care | Payer: Medicare Other | Source: Ambulatory Visit | Attending: Family Medicine | Admitting: Family Medicine

## 2018-07-25 DIAGNOSIS — R921 Mammographic calcification found on diagnostic imaging of breast: Secondary | ICD-10-CM

## 2018-08-31 DIAGNOSIS — Z23 Encounter for immunization: Secondary | ICD-10-CM | POA: Diagnosis not present

## 2018-09-23 DIAGNOSIS — J069 Acute upper respiratory infection, unspecified: Secondary | ICD-10-CM | POA: Diagnosis not present

## 2018-09-23 DIAGNOSIS — J040 Acute laryngitis: Secondary | ICD-10-CM | POA: Diagnosis not present

## 2018-09-25 ENCOUNTER — Emergency Department (HOSPITAL_COMMUNITY): Payer: Medicare Other

## 2018-09-25 ENCOUNTER — Other Ambulatory Visit: Payer: Self-pay

## 2018-09-25 ENCOUNTER — Encounter (HOSPITAL_COMMUNITY): Payer: Self-pay | Admitting: *Deleted

## 2018-09-25 ENCOUNTER — Inpatient Hospital Stay (HOSPITAL_COMMUNITY)
Admission: EM | Admit: 2018-09-25 | Discharge: 2018-09-27 | DRG: 641 | Disposition: A | Discharge status: Home / Self Care | Payer: Medicare Other | Attending: Family Medicine | Admitting: Family Medicine

## 2018-09-25 DIAGNOSIS — K760 Fatty (change of) liver, not elsewhere classified: Secondary | ICD-10-CM | POA: Diagnosis present

## 2018-09-25 DIAGNOSIS — E1122 Type 2 diabetes mellitus with diabetic chronic kidney disease: Secondary | ICD-10-CM | POA: Diagnosis not present

## 2018-09-25 DIAGNOSIS — I5032 Chronic diastolic (congestive) heart failure: Secondary | ICD-10-CM | POA: Diagnosis not present

## 2018-09-25 DIAGNOSIS — R001 Bradycardia, unspecified: Secondary | ICD-10-CM | POA: Diagnosis not present

## 2018-09-25 DIAGNOSIS — N179 Acute kidney failure, unspecified: Secondary | ICD-10-CM | POA: Diagnosis present

## 2018-09-25 DIAGNOSIS — R55 Syncope and collapse: Secondary | ICD-10-CM | POA: Diagnosis present

## 2018-09-25 DIAGNOSIS — M109 Gout, unspecified: Secondary | ICD-10-CM | POA: Diagnosis present

## 2018-09-25 DIAGNOSIS — I4510 Unspecified right bundle-branch block: Secondary | ICD-10-CM | POA: Diagnosis present

## 2018-09-25 DIAGNOSIS — Z6836 Body mass index (BMI) 36.0-36.9, adult: Secondary | ICD-10-CM

## 2018-09-25 DIAGNOSIS — S0990XA Unspecified injury of head, initial encounter: Secondary | ICD-10-CM | POA: Diagnosis not present

## 2018-09-25 DIAGNOSIS — R0789 Other chest pain: Secondary | ICD-10-CM | POA: Diagnosis not present

## 2018-09-25 DIAGNOSIS — E039 Hypothyroidism, unspecified: Secondary | ICD-10-CM | POA: Diagnosis present

## 2018-09-25 DIAGNOSIS — E860 Dehydration: Principal | ICD-10-CM | POA: Diagnosis present

## 2018-09-25 DIAGNOSIS — Z79890 Hormone replacement therapy (postmenopausal): Secondary | ICD-10-CM

## 2018-09-25 DIAGNOSIS — S299XXA Unspecified injury of thorax, initial encounter: Secondary | ICD-10-CM | POA: Diagnosis not present

## 2018-09-25 DIAGNOSIS — Z801 Family history of malignant neoplasm of trachea, bronchus and lung: Secondary | ICD-10-CM

## 2018-09-25 DIAGNOSIS — Z79899 Other long term (current) drug therapy: Secondary | ICD-10-CM

## 2018-09-25 DIAGNOSIS — E119 Type 2 diabetes mellitus without complications: Secondary | ICD-10-CM

## 2018-09-25 DIAGNOSIS — R079 Chest pain, unspecified: Secondary | ICD-10-CM | POA: Diagnosis not present

## 2018-09-25 DIAGNOSIS — Z833 Family history of diabetes mellitus: Secondary | ICD-10-CM

## 2018-09-25 DIAGNOSIS — E669 Obesity, unspecified: Secondary | ICD-10-CM | POA: Diagnosis present

## 2018-09-25 DIAGNOSIS — I10 Essential (primary) hypertension: Secondary | ICD-10-CM | POA: Diagnosis present

## 2018-09-25 DIAGNOSIS — I130 Hypertensive heart and chronic kidney disease with heart failure and stage 1 through stage 4 chronic kidney disease, or unspecified chronic kidney disease: Secondary | ICD-10-CM | POA: Diagnosis present

## 2018-09-25 DIAGNOSIS — M1990 Unspecified osteoarthritis, unspecified site: Secondary | ICD-10-CM | POA: Diagnosis present

## 2018-09-25 DIAGNOSIS — Z7984 Long term (current) use of oral hypoglycemic drugs: Secondary | ICD-10-CM

## 2018-09-25 DIAGNOSIS — J069 Acute upper respiratory infection, unspecified: Secondary | ICD-10-CM | POA: Diagnosis present

## 2018-09-25 DIAGNOSIS — Y92009 Unspecified place in unspecified non-institutional (private) residence as the place of occurrence of the external cause: Secondary | ICD-10-CM

## 2018-09-25 DIAGNOSIS — Z888 Allergy status to other drugs, medicaments and biological substances status: Secondary | ICD-10-CM

## 2018-09-25 DIAGNOSIS — Z803 Family history of malignant neoplasm of breast: Secondary | ICD-10-CM

## 2018-09-25 DIAGNOSIS — G4733 Obstructive sleep apnea (adult) (pediatric): Secondary | ICD-10-CM | POA: Diagnosis present

## 2018-09-25 DIAGNOSIS — N183 Chronic kidney disease, stage 3 (moderate): Secondary | ICD-10-CM | POA: Diagnosis present

## 2018-09-25 DIAGNOSIS — T447X5A Adverse effect of beta-adrenoreceptor antagonists, initial encounter: Secondary | ICD-10-CM | POA: Diagnosis present

## 2018-09-25 DIAGNOSIS — D649 Anemia, unspecified: Secondary | ICD-10-CM | POA: Diagnosis present

## 2018-09-25 DIAGNOSIS — K219 Gastro-esophageal reflux disease without esophagitis: Secondary | ICD-10-CM | POA: Diagnosis present

## 2018-09-25 LAB — BASIC METABOLIC PANEL
Anion gap: 12 (ref 5–15)
BUN: 53 mg/dL — AB (ref 8–23)
CALCIUM: 10.1 mg/dL (ref 8.9–10.3)
CO2: 25 mmol/L (ref 22–32)
CREATININE: 2.14 mg/dL — AB (ref 0.44–1.00)
Chloride: 96 mmol/L — ABNORMAL LOW (ref 98–111)
GFR calc Af Amer: 25 mL/min — ABNORMAL LOW (ref >60)
GFR, EST NON AFRICAN AMERICAN: 22 mL/min — AB (ref >60)
Glucose, Bld: 209 mg/dL — ABNORMAL HIGH (ref 70–99)
POTASSIUM: 3.9 mmol/L (ref 3.5–5.1)
SODIUM: 133 mmol/L — AB (ref 135–145)

## 2018-09-25 LAB — CBC
HCT: 40.4 % (ref 36.0–46.0)
Hemoglobin: 13.1 g/dL (ref 12.0–15.0)
MCH: 28.4 pg (ref 26.0–34.0)
MCHC: 32.4 g/dL (ref 30.0–36.0)
MCV: 87.4 fL (ref 80.0–100.0)
PLATELETS: 239 10*3/uL (ref 150–400)
RBC: 4.62 MIL/uL (ref 3.87–5.11)
RDW: 14 % (ref 11.5–15.5)
WBC: 11.9 10*3/uL — ABNORMAL HIGH (ref 4.0–10.5)
nRBC: 0 % (ref 0.0–0.2)

## 2018-09-25 LAB — I-STAT TROPONIN, ED: TROPONIN I, POC: 0 ng/mL (ref 0.00–0.08)

## 2018-09-25 MED ORDER — ONDANSETRON HCL 4 MG PO TABS: 4.0000 mg | ORAL_TABLET | Freq: Four times a day (QID) | ORAL | Status: DC | PRN

## 2018-09-25 MED ORDER — PRAVASTATIN SODIUM 20 MG PO TABS
20.0000 mg | ORAL_TABLET | Freq: Every day | ORAL | Status: DC
  Administered 2018-09-26: 20 mg via ORAL
  Filled 2018-09-25 (×2): qty 1

## 2018-09-25 MED ORDER — METOPROLOL SUCCINATE ER 200 MG PO TB24: 200.0000 mg | ORAL_TABLET | Freq: Every day | ORAL | Status: DC

## 2018-09-25 MED ORDER — HYDRALAZINE HCL 50 MG PO TABS
50.0000 mg | ORAL_TABLET | Freq: Three times a day (TID) | ORAL | Status: DC
  Administered 2018-09-25: 50 mg via ORAL
  Filled 2018-09-25 (×2): qty 1

## 2018-09-25 MED ORDER — SODIUM CHLORIDE 0.9 % IV SOLN
INTRAVENOUS | Status: DC
  Administered 2018-09-25: via INTRAVENOUS

## 2018-09-25 MED ORDER — ACETAMINOPHEN 650 MG RE SUPP: 650.0000 mg | Freq: Four times a day (QID) | RECTAL | Status: DC | PRN

## 2018-09-25 MED ORDER — GUAIFENESIN-CODEINE 100-10 MG/5ML PO SOLN
5.0000 mL | Freq: Three times a day (TID) | ORAL | Status: DC | PRN
  Administered 2018-09-26: 5 mL via ORAL
  Filled 2018-09-25: qty 5

## 2018-09-25 MED ORDER — LEVOTHYROXINE SODIUM 88 MCG PO TABS
88.0000 ug | ORAL_TABLET | Freq: Every day | ORAL | Status: DC
  Administered 2018-09-26 – 2018-09-27 (×2): 88 ug via ORAL
  Filled 2018-09-25 (×2): qty 1

## 2018-09-25 MED ORDER — ONDANSETRON HCL 4 MG/2ML IJ SOLN: 4.0000 mg | Freq: Four times a day (QID) | INTRAMUSCULAR | Status: DC | PRN

## 2018-09-25 MED ORDER — ALLOPURINOL 300 MG PO TABS
300.0000 mg | ORAL_TABLET | ORAL | Status: DC
  Administered 2018-09-27: 300 mg via ORAL
  Filled 2018-09-25: qty 1

## 2018-09-25 MED ORDER — SODIUM CHLORIDE 0.9 % IV SOLN
1000.0000 mL | INTRAVENOUS | Status: DC
  Administered 2018-09-25: 1000 mL via INTRAVENOUS

## 2018-09-25 MED ORDER — ADULT MULTIVITAMIN W/MINERALS CH
2.0000 | ORAL_TABLET | Freq: Every day | ORAL | Status: DC
  Administered 2018-09-26 – 2018-09-27 (×2): 2 via ORAL
  Filled 2018-09-25 (×2): qty 2

## 2018-09-25 MED ORDER — ENOXAPARIN SODIUM 30 MG/0.3ML ~~LOC~~ SOLN: 30.0000 mg | SUBCUTANEOUS | Status: DC

## 2018-09-25 MED ORDER — ACETAMINOPHEN 325 MG PO TABS
650.0000 mg | ORAL_TABLET | Freq: Four times a day (QID) | ORAL | Status: DC | PRN
  Administered 2018-09-26 – 2018-09-27 (×2): 650 mg via ORAL
  Filled 2018-09-25 (×2): qty 2

## 2018-09-25 MED ORDER — INSULIN ASPART 100 UNIT/ML ~~LOC~~ SOLN: 0.0000 [IU] | Freq: Three times a day (TID) | SUBCUTANEOUS | Status: DC

## 2018-09-25 MED ORDER — SODIUM CHLORIDE 0.9 % IV BOLUS (SEPSIS)
500.0000 mL | Freq: Once | INTRAVENOUS | Status: AC
  Administered 2018-09-25: 500 mL via INTRAVENOUS

## 2018-09-25 MED ORDER — PANTOPRAZOLE SODIUM 20 MG PO TBEC
20.0000 mg | DELAYED_RELEASE_TABLET | Freq: Every day | ORAL | Status: DC
  Administered 2018-09-26 – 2018-09-27 (×2): 20 mg via ORAL
  Filled 2018-09-25 (×3): qty 1

## 2018-09-25 NOTE — ED Provider Notes (Signed)
[place] EMERGENCY DEPARTMENT Provider Note   CSN: [id number] Arrival date & time: 09/25/18  2053     History   Chief Complaint Chief Complaint  Patient presents with  . Chest Pain  . Loss of Consciousness    HPI Jody Oneill is a 72 y.o. female.  HPI Pt had some pain in her chest today.  It was off and on all day.  SHe felt like she had some gas and needed to burp.  This evening she started to feel lightheaded, she had to put her head down.  Her chest was hurting.  THe next thing she knows she lost consciousness. Pt was found by her daughter.  She was out for seconds to minutes.  Pt was found by her daughter on the ground.  Her pain has resolved.   She does not regularly have syncopal episode.   NO hx of PE or DVT.  No recent trips or travel.     Past Medical History:  Diagnosis Date  . Chronic kidney disease   . Fatty liver   . GERD (gastroesophageal reflux disease)   . Gout   . History of echocardiogram    Echo 8/18: mild conc LVH, EF 55-60, no RWMA, Gr 2 DD, mod LAE  . History of nuclear stress test    Nuclear stress test 8/18: EF 65, no ischemia or scar, normal study  . Hypertension   . Hypothyroid   . Obesity    fatty liver by [place]  . OSA on CPAP   . Osteoarthritis   . Type 2 diabetes mellitus [facility])     Patient Active Problem List   Diagnosis Date Noted  . Essential hypertension 07/28/2017  . Type 2 diabetes mellitus without complications ([place]) 81/44/8185  . CKD (chronic kidney disease) stage 3, GFR 30-59 ml/min (HCC) 07/28/2017  . RBBB (right bundle branch block) 07/28/2017  . Chronic diastolic CHF (congestive heart failure) ([place]) 06/15/2015  . DOE (dyspnea on exertion) 12/02/2014    Past Surgical History:  Procedure Laterality Date  . BIOPSY BREAST Left    many years ago     OB History   None      Home Medications    Prior to Admission medications   Medication Sig Start Date End Date Taking? Authorizing Provider    acetaminophen (TYLENOL) 325 MG tablet Take 650 mg by mouth every 6 (six) hours as needed for mild pain, moderate pain or headache.   Yes [provider]  allopurinol (ZYLOPRIM) 300 MG tablet Take 300 mg by mouth every other day.    Yes [provider]  dextromethorphan (DELSYM) 30 MG/5ML liquid Take 30 mg by mouth as needed for cough.   Yes [provider]  furosemide (LASIX) 40 MG tablet Take 40 mg by mouth 2 (two) times daily. 08/21/18  Yes [provider]  GLIPIZIDE XL 10 MG 24 hr tablet Take 10 mg by mouth daily.  06/01/15  Yes [provider]  guaiFENesin-codeine 100-10 MG/5ML syrup Take 5 mLs by mouth 3 (three) times daily as needed for cough.   Yes [provider]  hydrALAZINE (APRESOLINE) 50 MG tablet Take 1 tablet (50 mg total) by mouth 3 (three) times daily. 04/02/18  Yes Nahser, Wonda Cheng, MD  lansoprazole (PREVACID) 30 MG capsule Take 30 mg by mouth daily. 11/18/14  Yes [provider]  levothyroxine (SYNTHROID, LEVOTHROID) 88 MCG tablet Take 88 mcg by mouth daily before breakfast.   Yes [provider]  lisinopril (PRINIVIL,ZESTRIL) 40 MG tablet Take 40 mg by mouth daily.   Yes [provider]  metoprolol (TOPROL-XL) 200 MG 24 hr tablet Take 200 mg by mouth daily.   Yes [provider]  Multiple Vitamin (MULTIVITAMIN) tablet Take 2 tablets by mouth daily.    Yes [provider]  potassium chloride (K-DUR) 10 MEQ tablet Take 1 tablet (10 mEq total) by mouth 2 (two) times daily. 04/02/18  Yes Nahser, Wonda Cheng, MD  pravastatin (PRAVACHOL) 20 MG tablet Take 20 mg by mouth daily.   Yes [provider]    Family History Family History  Problem Relation Age of Onset  . Lung cancer Mother   . Other Father        meningitis  . Diabetes Father   . Breast cancer Maternal Aunt   . Breast cancer Maternal Aunt     Social History Social History   Tobacco Use  . Smoking status: Never  Smoker  . Smokeless tobacco: Never Used  Substance Use Topics  . Alcohol use: No    Alcohol/week: 0.0 standard drinks    Frequency: Never  . Drug use: No     Allergies   Amlodipine besylate and Hydrochlorothiazide   Review of Systems Review of Systems  Constitutional: Negative for fever.  HENT: Positive for postnasal drip and rhinorrhea.   Respiratory: Positive for cough.   All other systems reviewed and are negative.    Physical Exam Updated Vital Signs BP (!) 109/57   Pulse (!) 56   Temp 98.5 F (36.9 C) (Oral)   Resp 15   Ht 1.626 m (5\' 4" )   Wt 95.7 kg   SpO2 100%   BMI 36.22 kg/m   Physical Exam  Constitutional: She appears well-developed and well-nourished. No distress.  HENT:  Head: Normocephalic and atraumatic.  Right Ear: External ear normal.  Left Ear: External ear normal.  Eyes: Conjunctivae are normal. Right eye exhibits no discharge. Left eye exhibits no discharge. No scleral icterus.  Neck: Neck supple. No tracheal deviation present.  Cardiovascular: Normal rate, regular rhythm and intact distal pulses.  Pulmonary/Chest: Effort normal and breath sounds normal. No stridor. No respiratory distress. She has no wheezes. She has no rales.  Abdominal: Soft. Bowel sounds are normal. She exhibits no distension. There is no tenderness. There is no rebound and no guarding.  Musculoskeletal: She exhibits no edema or tenderness.  Neurological: She is alert. She has normal strength. No cranial nerve deficit (no facial droop, extraocular movements intact, no slurred speech) or sensory deficit. She exhibits normal muscle tone. She displays no seizure activity. Coordination normal.  Skin: Skin is warm and dry. No rash noted.  Psychiatric: She has a normal mood and affect.  Nursing note and vitals reviewed.    ED Treatments / Results  Labs (all labs ordered are listed, but only abnormal results are displayed) Labs Reviewed  BASIC METABOLIC PANEL - Abnormal;  Notable for the following components:      Result Value   Sodium 133 (*)    Chloride 96 (*)    Glucose, Bld 209 (*)    BUN 53 (*)    Creatinine, Ser 2.14 (*)    GFR calc non Af Amer 22 (*)    GFR calc Af Amer 25 (*)    All other components within normal limits  CBC - Abnormal; Notable for the following components:   WBC 11.9 (*)    All other components within normal  limits  I-STAT TROPONIN, ED    EKG EKG Interpretation  Date/Time:  Tuesday September 25 2018 20:57:03 EDT Ventricular Rate:  58 PR Interval:    QRS Duration: 138 QT Interval:  481 QTC Calculation: 473 R Axis:   -13 Text Interpretation:  Sinus rhythm Right bundle branch block ST elevation, consider inferior injury Poor data quality in current ECG precludes serial comparison Confirmed by Dorie Rank [phone]) on 09/25/2018 9:00:19 PM   Radiology Dg Chest 2 View  Result Date: 09/25/2018 CLINICAL DATA:  Dizziness tonight. [place] out of kitchen chair. Mid chest pain. History of diabetes. Nonsmoker. EXAM: CHEST - 2 VIEW COMPARISON:  09/15/2010 FINDINGS: Mild cardiac enlargement. No pulmonary vascular congestion, edema, or consolidation. No blunting of costophrenic angles. No pneumothorax. Mediastinal contours appear intact. Degenerative changes in the spine. IMPRESSION: Mild cardiac enlargement. No evidence of active pulmonary disease. Electronically Signed   By: Lucienne Capers M.D.   On: 09/25/2018 21:24    Procedures Procedures (including critical care time)  Medications Ordered in ED Medications  sodium chloride 0.9 % bolus 500 mL (has no administration in time range)    Followed by  0.9 %  sodium chloride infusion (has no administration in time range)     Initial Impression / Assessment and Plan / ED Course  I have reviewed the triage vital signs and the nursing notes.  Pertinent labs & imaging results that were available during my care of the patient were reviewed by me and considered in my medical decision  making (see chart for details).  Clinical Course as of Sep 25 2213  Tue Sep 25, 2018  2152 Creatinine is elevated compared to previous values   [JK]    Clinical Course User Index [JK] Dorie Rank, MD    Presented to the emergency room after a syncopal episode.  Patient admits to not eating or drinking as well recently because of a recent URI diagnosis.  Patient's laboratory tests do show an elevated BUN and creatinine.  Creatinine is nearly doubled compared to previous values.  She may have a component of dehydration contributing to her symptoms however this would not cause the chest discomfort that she experienced.  Patient is at high risk for acute cardiac etiology considering her diabetes , and hypertension.  I have ordered IV fluids.  I will consult the medical service for admission for cardiac monitoring, serial troponins and IV hydration  Final Clinical Impressions(s) / ED Diagnoses   Final diagnoses:  Syncope, unspecified syncope type  AKI (acute kidney injury) ([place])  Dehydration  Chest pain, unspecified type      Dorie Rank, MD 09/25/18 2214

## 2018-09-25 NOTE — H&P (Addendum)
History and Physical    Jody Oneill JJO:[id number] DOB: 03/28/1946 DOA: 09/25/2018  PCP: Darcus Austin, MD  Patient coming from: Home.  Chief Complaint: Loss of consciousness and chest pain.  HPI: Jody Oneill is a 72 y.o. female with history of chronic diastolic CHF last EF measured was in August 2018 showed EF of 55 to 60% with grade 2 diastolic dysfunction on Lasix, hypertension on metoprolol and hydralazine, hypothyroidism, hyperlipidemia, diabetes mellitus type 2 and chronic kidney disease had a syncopal episode while having dinner last evening.  Patient initially rested onto the table following which patient fell onto the floor but did not hit her head and was witnessed by patient's daughter.  As per the family patient lost consciousness very briefly for less than 10 seconds and regained spontaneously.  Did not have any seizure-like activities or incontinence of urine or bowel.  Following the fall patient had some chest pain across the chest which resolved.  Patient also complained of chest pain early in the morning.  At the time patient felt like a chest pressure and indigestion.  Patient states he has not been eating well with upper respiratory tract symptoms over the last 3 to 4 days and had gone to urgent care center on Saturday and was told she had a viral prodrome and was sent treated symptomatically.  She was not eating well because of sore throat-like feeling.  Denies any nausea vomiting or abdominal pain.  ED Course: In the ER patient was chest pain-free.  Labs revealed acute renal failure with creatinine worsening from 1.2 in April 2.1.  Patient was given fluid bolus in the ER and continuous infusion.  EKG was showing sinus bradycardia.  Admitted for syncope likely could be from dehydration or bradycardia.  Review of Systems: As per HPI, rest all negative.   Past Medical History:  Diagnosis Date  . Chronic kidney disease   . Fatty liver   . GERD (gastroesophageal reflux  disease)   . Gout   . History of echocardiogram    Echo 8/18: mild conc LVH, EF 55-60, no RWMA, Gr 2 DD, mod LAE  . History of nuclear stress test    Nuclear stress test 8/18: EF 65, no ischemia or scar, normal study  . Hypertension   . Hypothyroid   . Obesity    fatty liver by [place]  . OSA on CPAP   . Osteoarthritis   . Type 2 diabetes mellitus ([place])     Past Surgical History:  Procedure Laterality Date  . BIOPSY BREAST Left    many years ago     reports that she has never smoked. She has never used smokeless tobacco. She reports that she does not drink alcohol or use drugs.  Allergies  Allergen Reactions  . Amlodipine Besylate Swelling  . Hydrochlorothiazide     CAUSES GOUT FLARE UP    Family History  Problem Relation Age of Onset  . Lung cancer Mother   . Other Father        meningitis  . Diabetes Father   . Breast cancer Maternal Aunt   . Breast cancer Maternal Aunt     Prior to Admission medications   Medication Sig Start Date End Date Taking? Authorizing Provider  acetaminophen (TYLENOL) 325 MG tablet Take 650 mg by mouth every 6 (six) hours as needed for mild pain, moderate pain or headache.   Yes [provider]  allopurinol (ZYLOPRIM) 300 MG tablet Take 300 mg by mouth every other  day.    Yes [provider]  dextromethorphan (DELSYM) 30 MG/5ML liquid Take 30 mg by mouth as needed for cough.   Yes [provider]  furosemide (LASIX) 40 MG tablet Take 40 mg by mouth 2 (two) times daily. 08/21/18  Yes [provider]  GLIPIZIDE XL 10 MG 24 hr tablet Take 10 mg by mouth daily.  06/01/15  Yes [provider]  guaiFENesin-codeine 100-10 MG/5ML syrup Take 5 mLs by mouth 3 (three) times daily as needed for cough.   Yes [provider]  hydrALAZINE (APRESOLINE) 50 MG tablet Take 1 tablet (50 mg total) by mouth 3 (three) times daily. 04/02/18  Yes Nahser, Wonda Cheng, MD  lansoprazole (PREVACID) 30 MG capsule Take 30 mg by  mouth daily. 11/18/14  Yes [provider]  levothyroxine (SYNTHROID, LEVOTHROID) 88 MCG tablet Take 88 mcg by mouth daily before breakfast.   Yes [provider]  lisinopril (PRINIVIL,ZESTRIL) 40 MG tablet Take 40 mg by mouth daily.   Yes [provider]  metoprolol (TOPROL-XL) 200 MG 24 hr tablet Take 200 mg by mouth daily.   Yes [provider]  Multiple Vitamin (MULTIVITAMIN) tablet Take 2 tablets by mouth daily.    Yes [provider]  potassium chloride (K-DUR) 10 MEQ tablet Take 1 tablet (10 mEq total) by mouth 2 (two) times daily. 04/02/18  Yes Nahser, Wonda Cheng, MD  pravastatin (PRAVACHOL) 20 MG tablet Take 20 mg by mouth daily.   Yes [provider]    Physical Exam: Vitals:   09/25/18 2153 09/25/18 2200 09/25/18 2215 09/25/18 2230  BP:  (!) 133/53 (!) 146/63 129/77  Pulse:  (!) 50 (!) 54 (!) 48  Resp:    16  Temp: 98.5 F (36.9 C)     TempSrc: Oral     SpO2:  99% 99% 100%  Weight:      Height:          Constitutional: Moderately built and nourished. Vitals:   09/25/18 2153 09/25/18 2200 09/25/18 2215 09/25/18 2230  BP:  (!) 133/53 (!) 146/63 129/77  Pulse:  (!) 50 (!) 54 (!) 48  Resp:    16  Temp: 98.5 F (36.9 C)     TempSrc: Oral     SpO2:  99% 99% 100%  Weight:      Height:       Eyes: Anicteric no pallor. ENMT: No discharge from the ears eyes nose or mouth. Neck: No mass felt.  No neck rigidity.  No JVD appreciated. Respiratory: No rhonchi or crepitations. Cardiovascular: S1-S2 heard no murmurs appreciated. Abdomen: Soft nontender bowel sounds present. Musculoskeletal: No edema.  No joint effusion. Skin: No rash.  Skin appears warm. Neurologic: Alert awake oriented to time place and person.  Moves all extremities. Psychiatric: Appears normal per normal affect.   Labs on Admission: I have personally reviewed following labs and imaging studies  CBC: Recent Labs  Lab 09/25/18 2101  WBC 11.9*  HGB  13.1  HCT 40.4  MCV 87.4  PLT 413   Basic Metabolic Panel: Recent Labs  Lab 09/25/18 2101  NA 133*  K 3.9  CL 96*  CO2 25  GLUCOSE 209*  BUN 53*  CREATININE 2.14*  CALCIUM 10.1   GFR: Estimated Creatinine Clearance: 26.7 mL/min (A) (by C-G formula based on SCr of 2.14 mg/dL (H)). Liver Function Tests: No results for input(s): AST, ALT, ALKPHOS, BILITOT, PROT, ALBUMIN in the last 168 hours. No results for  input(s): LIPASE, AMYLASE in the last 168 hours. No results for input(s): AMMONIA in the last 168 hours. Coagulation Profile: No results for input(s): INR, PROTIME in the last 168 hours. Cardiac Enzymes: No results for input(s): CKTOTAL, CKMB, CKMBINDEX, TROPONINI in the last 168 hours. BNP (last 3 results) No results for input(s): PROBNP in the last 8760 hours. HbA1C: No results for input(s): HGBA1C in the last 72 hours. CBG: No results for input(s): GLUCAP in the last 168 hours. Lipid Profile: No results for input(s): CHOL, HDL, LDLCALC, TRIG, CHOLHDL, LDLDIRECT in the last 72 hours. Thyroid Function Tests: No results for input(s): TSH, T4TOTAL, FREET4, T3FREE, THYROIDAB in the last 72 hours. Anemia Panel: No results for input(s): VITAMINB12, FOLATE, FERRITIN, TIBC, IRON, RETICCTPCT in the last 72 hours. Urine analysis:    Component Value Date/Time   COLORURINE YELLOW 09/15/2010 2152   APPEARANCEUR CLEAR 09/15/2010 2152   LABSPEC 1.009 09/15/2010 2152   PHURINE 5.5 09/15/2010 2152   GLUCOSEU NEGATIVE 09/15/2010 2152   HGBUR NEGATIVE 09/15/2010 2152   BILIRUBINUR NEGATIVE 09/15/2010 2152   KETONESUR NEGATIVE 09/15/2010 2152   PROTEINUR NEGATIVE 09/15/2010 2152   UROBILINOGEN 0.2 09/15/2010 2152   NITRITE NEGATIVE 09/15/2010 2152   LEUKOCYTESUR SMALL (A) 09/15/2010 2152   Sepsis Labs: @LABRCNTIP (procalcitonin:4,lacticidven:4) )No results found for this or any previous visit (from the past 240 hour(s)).   Radiological Exams on Admission: Dg Chest 2  View  Result Date: 09/25/2018 CLINICAL DATA:  Dizziness tonight. [place] out of kitchen chair. Mid chest pain. History of diabetes. Nonsmoker. EXAM: CHEST - 2 VIEW COMPARISON:  09/15/2010 FINDINGS: Mild cardiac enlargement. No pulmonary vascular congestion, edema, or consolidation. No blunting of costophrenic angles. No pneumothorax. Mediastinal contours appear intact. Degenerative changes in the spine. IMPRESSION: Mild cardiac enlargement. No evidence of active pulmonary disease. Electronically Signed   By: Lucienne Capers M.D.   On: 09/25/2018 21:24    EKG: Independently reviewed.  Sinus bradycardia with RBBB.  Assessment/Plan Principal Problem:   Syncope Active Problems:   Chronic diastolic CHF (congestive heart failure) (HCC)   Essential hypertension   Type 2 diabetes mellitus without complications (HCC)   ARF (acute renal failure) (HCC)   Chest pain    1. Syncope -could be from either dehydration from recent poor oral intake with renal failure or could be from bradycardia.  Shortly after I examined the patient patient wanted to bradycardia the heart rate around 30s.  I discontinued patient's metoprolol which patient usually takes in the morning.  Discussed with cardiologist Dr. [place] was on call advised to holding metoprolol and closely observe.  Will check TSH and continue to hydrate for the dehydration. 2. Chest pain -had 2 episodes of chest pain today.  First 1 was in the morning when patient was having breakfast which patient felt was like indigestion which improved without any intervention.  Second no notes out of the fall.  Troponin mildly elevated.  Patient is presently chest pain-free we will cycle cardiac markers check d-dimer. 3. Acute renal failure likely from poor oral intake -continue to hydrate follow metabolic panel. 4. Recent upper respiratory symptoms on exam does not have any throat congestion or tenderness in the neck area. 5. Diabetes mellitus type 2 -as per the  family patient CBG was in the normal range during the episode.  Will keep patient on sliding scale coverage for now. 6. History of chronic diastolic CHF last EF measured in August 2018 was 55 to 60% with grade 2 diastolic dysfunction.  Holding  Lasix due to renal failure. 7. Hypertension holding metoprolol for bradycardia and will continue hydralazine PRN IV hydralazine for systolic more than 740. 8. Anemia follow CBC. 9. History of gout on allopurinol. 10. Sleep apnea on CPAP. 11. History of GERD on PPI.   DVT prophylaxis: Lovenox. Code Status: Full code. Family Communication: Patient's daughter and son-in-law. Disposition Plan: Home. Consults called: Cardiology. Admission status: Observation.   Rise Patience MD [facility] Pager [phone].  If 7PM-7AM, please contact night-coverage [email or web address] Password [email or web address]  09/25/2018, 10:40 PM

## 2018-09-25 NOTE — ED Triage Notes (Signed)
Pt has been having intermittent chest burning throughout the day. Was at dinner tonight, started feeling nauseous with dizziness and burning in her chest. Got up from the table, had a syncopal episode and fell to the ground landing on her side. Pt has been chest pain free since EMS picked her up, did receive 324mg  ASA.

## 2018-09-25 NOTE — ED Notes (Signed)
Patient is connected to cardiac monitoring and given a blanket for comfort.

## 2018-09-26 ENCOUNTER — Observation Stay (HOSPITAL_COMMUNITY): Payer: Medicare Other

## 2018-09-26 ENCOUNTER — Observation Stay (HOSPITAL_BASED_OUTPATIENT_CLINIC_OR_DEPARTMENT_OTHER): Payer: Medicare Other

## 2018-09-26 DIAGNOSIS — K219 Gastro-esophageal reflux disease without esophagitis: Secondary | ICD-10-CM | POA: Diagnosis present

## 2018-09-26 DIAGNOSIS — R079 Chest pain, unspecified: Secondary | ICD-10-CM | POA: Diagnosis not present

## 2018-09-26 DIAGNOSIS — T447X5A Adverse effect of beta-adrenoreceptor antagonists, initial encounter: Secondary | ICD-10-CM | POA: Diagnosis present

## 2018-09-26 DIAGNOSIS — I5032 Chronic diastolic (congestive) heart failure: Secondary | ICD-10-CM | POA: Diagnosis present

## 2018-09-26 DIAGNOSIS — I4510 Unspecified right bundle-branch block: Secondary | ICD-10-CM | POA: Diagnosis present

## 2018-09-26 DIAGNOSIS — R609 Edema, unspecified: Secondary | ICD-10-CM | POA: Diagnosis not present

## 2018-09-26 DIAGNOSIS — E669 Obesity, unspecified: Secondary | ICD-10-CM | POA: Diagnosis present

## 2018-09-26 DIAGNOSIS — N179 Acute kidney failure, unspecified: Secondary | ICD-10-CM | POA: Diagnosis present

## 2018-09-26 DIAGNOSIS — N183 Chronic kidney disease, stage 3 (moderate): Secondary | ICD-10-CM | POA: Diagnosis present

## 2018-09-26 DIAGNOSIS — E860 Dehydration: Secondary | ICD-10-CM | POA: Diagnosis present

## 2018-09-26 DIAGNOSIS — Z7984 Long term (current) use of oral hypoglycemic drugs: Secondary | ICD-10-CM | POA: Diagnosis not present

## 2018-09-26 DIAGNOSIS — I10 Essential (primary) hypertension: Secondary | ICD-10-CM

## 2018-09-26 DIAGNOSIS — J069 Acute upper respiratory infection, unspecified: Secondary | ICD-10-CM | POA: Diagnosis present

## 2018-09-26 DIAGNOSIS — R55 Syncope and collapse: Secondary | ICD-10-CM

## 2018-09-26 DIAGNOSIS — Z801 Family history of malignant neoplasm of trachea, bronchus and lung: Secondary | ICD-10-CM | POA: Diagnosis not present

## 2018-09-26 DIAGNOSIS — M1990 Unspecified osteoarthritis, unspecified site: Secondary | ICD-10-CM | POA: Diagnosis present

## 2018-09-26 DIAGNOSIS — G4733 Obstructive sleep apnea (adult) (pediatric): Secondary | ICD-10-CM | POA: Diagnosis present

## 2018-09-26 DIAGNOSIS — M109 Gout, unspecified: Secondary | ICD-10-CM | POA: Diagnosis present

## 2018-09-26 DIAGNOSIS — R001 Bradycardia, unspecified: Secondary | ICD-10-CM | POA: Diagnosis present

## 2018-09-26 DIAGNOSIS — D649 Anemia, unspecified: Secondary | ICD-10-CM | POA: Diagnosis present

## 2018-09-26 DIAGNOSIS — Y92009 Unspecified place in unspecified non-institutional (private) residence as the place of occurrence of the external cause: Secondary | ICD-10-CM | POA: Diagnosis not present

## 2018-09-26 DIAGNOSIS — Z6836 Body mass index (BMI) 36.0-36.9, adult: Secondary | ICD-10-CM | POA: Diagnosis not present

## 2018-09-26 DIAGNOSIS — K760 Fatty (change of) liver, not elsewhere classified: Secondary | ICD-10-CM | POA: Diagnosis present

## 2018-09-26 DIAGNOSIS — Z79899 Other long term (current) drug therapy: Secondary | ICD-10-CM | POA: Diagnosis not present

## 2018-09-26 DIAGNOSIS — Z79890 Hormone replacement therapy (postmenopausal): Secondary | ICD-10-CM | POA: Diagnosis not present

## 2018-09-26 DIAGNOSIS — E1122 Type 2 diabetes mellitus with diabetic chronic kidney disease: Secondary | ICD-10-CM | POA: Diagnosis present

## 2018-09-26 DIAGNOSIS — E039 Hypothyroidism, unspecified: Secondary | ICD-10-CM | POA: Diagnosis present

## 2018-09-26 DIAGNOSIS — I130 Hypertensive heart and chronic kidney disease with heart failure and stage 1 through stage 4 chronic kidney disease, or unspecified chronic kidney disease: Secondary | ICD-10-CM | POA: Diagnosis present

## 2018-09-26 DIAGNOSIS — Z888 Allergy status to other drugs, medicaments and biological substances status: Secondary | ICD-10-CM | POA: Diagnosis not present

## 2018-09-26 LAB — GLUCOSE, CAPILLARY
Glucose-Capillary: 129 mg/dL — ABNORMAL HIGH (ref 70–99)
Glucose-Capillary: 136 mg/dL — ABNORMAL HIGH (ref 70–99)
Glucose-Capillary: 136 mg/dL — ABNORMAL HIGH (ref 70–99)
Glucose-Capillary: 135 mg/dL — ABNORMAL HIGH (ref 70–99)

## 2018-09-26 LAB — HEPARIN LEVEL (UNFRACTIONATED): Heparin Unfractionated: 0.73 IU/mL — ABNORMAL HIGH (ref 0.30–0.70)

## 2018-09-26 LAB — CBC
HCT: 33.5 % — ABNORMAL LOW (ref 36.0–46.0)
Hemoglobin: 11 g/dL — ABNORMAL LOW (ref 12.0–15.0)
MCH: 28.4 pg (ref 26.0–34.0)
MCHC: 32.8 g/dL (ref 30.0–36.0)
MCV: 86.3 fL (ref 80.0–100.0)
Platelets: 212 10*3/uL (ref 150–400)
RBC: 3.88 MIL/uL (ref 3.87–5.11)
RDW: 13.7 % (ref 11.5–15.5)
WBC: 11.2 10*3/uL — AB (ref 4.0–10.5)
nRBC: 0 % (ref 0.0–0.2)

## 2018-09-26 LAB — BASIC METABOLIC PANEL
ANION GAP: 14 (ref 5–15)
BUN: 54 mg/dL — ABNORMAL HIGH (ref 8–23)
CALCIUM: 9.1 mg/dL (ref 8.9–10.3)
CO2: 24 mmol/L (ref 22–32)
Chloride: 99 mmol/L (ref 98–111)
Creatinine, Ser: 1.89 mg/dL — ABNORMAL HIGH (ref 0.44–1.00)
GFR calc non Af Amer: 25 mL/min — ABNORMAL LOW (ref >60)
GFR, EST AFRICAN AMERICAN: 29 mL/min — AB (ref >60)
Glucose, Bld: 219 mg/dL — ABNORMAL HIGH (ref 70–99)
Potassium: 4 mmol/L (ref 3.5–5.1)
SODIUM: 137 mmol/L (ref 135–145)

## 2018-09-26 LAB — TROPONIN I
TROPONIN I: 0.04 ng/mL — AB (ref <0.03)
Troponin I: 0.06 ng/mL (ref <0.03)
Troponin I: 0.05 ng/mL (ref <0.03)

## 2018-09-26 LAB — D-DIMER, QUANTITATIVE: D-Dimer, Quant: 10.71 ug/mL-FEU — ABNORMAL HIGH (ref 0.00–0.50)

## 2018-09-26 LAB — MRSA PCR SCREENING: MRSA by PCR: NEGATIVE

## 2018-09-26 LAB — TSH: TSH: 2.564 u[IU]/mL (ref 0.350–4.500)

## 2018-09-26 MED ORDER — SODIUM CHLORIDE 0.9 % IV SOLN
INTRAVENOUS | Status: DC
  Administered 2018-09-26: 18:00:00 via INTRAVENOUS

## 2018-09-26 MED ORDER — TECHNETIUM TO 99M ALBUMIN AGGREGATED
4.0000 | Freq: Once | INTRAVENOUS | Status: AC | PRN
  Administered 2018-09-26: 4 via INTRAVENOUS

## 2018-09-26 MED ORDER — ASPIRIN 81 MG PO CHEW
CHEWABLE_TABLET | ORAL | Status: AC
  Filled 2018-09-26: qty 1

## 2018-09-26 MED ORDER — HEPARIN BOLUS VIA INFUSION
3000.0000 [IU] | Freq: Once | INTRAVENOUS | Status: AC
  Administered 2018-09-26: 3000 [IU] via INTRAVENOUS
  Filled 2018-09-26: qty 3000

## 2018-09-26 MED ORDER — HYDRALAZINE HCL 50 MG PO TABS
100.0000 mg | ORAL_TABLET | Freq: Three times a day (TID) | ORAL | Status: DC
  Administered 2018-09-26: 100 mg via ORAL
  Filled 2018-09-26: qty 2

## 2018-09-26 MED ORDER — ASPIRIN 81 MG PO CHEW
81.0000 mg | CHEWABLE_TABLET | Freq: Once | ORAL | Status: AC
  Administered 2018-09-26: 81 mg via ORAL

## 2018-09-26 MED ORDER — HEPARIN SODIUM (PORCINE) 5000 UNIT/ML IJ SOLN
5000.0000 [IU] | Freq: Three times a day (TID) | INTRAMUSCULAR | Status: DC
  Administered 2018-09-26 – 2018-09-27 (×2): 5000 [IU] via SUBCUTANEOUS
  Filled 2018-09-26 (×2): qty 1

## 2018-09-26 MED ORDER — HYDRALAZINE HCL 50 MG PO TABS
50.0000 mg | ORAL_TABLET | Freq: Three times a day (TID) | ORAL | Status: DC
  Administered 2018-09-26 – 2018-09-27 (×2): 50 mg via ORAL
  Filled 2018-09-26 (×2): qty 1

## 2018-09-26 MED ORDER — ISOSORBIDE MONONITRATE ER 30 MG PO TB24
30.0000 mg | ORAL_TABLET | Freq: Every day | ORAL | Status: DC
  Administered 2018-09-26 – 2018-09-27 (×2): 30 mg via ORAL
  Filled 2018-09-26 (×2): qty 1

## 2018-09-26 MED ORDER — TECHNETIUM TC 99M DIETHYLENETRIAME-PENTAACETIC ACID
30.0000 | Freq: Once | INTRAVENOUS | Status: AC | PRN
  Administered 2018-09-26: 30 via RESPIRATORY_TRACT

## 2018-09-26 MED ORDER — HEPARIN (PORCINE) IN NACL 100-0.45 UNIT/ML-% IJ SOLN
1350.0000 [IU]/h | INTRAMUSCULAR | Status: DC
  Administered 2018-09-26: 1400 [IU]/h via INTRAVENOUS
  Filled 2018-09-26: qty 250

## 2018-09-26 NOTE — Progress Notes (Signed)
ANTICOAGULATION CONSULT NOTE - Follow Up Consult  Pharmacy Consult for heparin Indication: r/o PE  Allergies  Allergen Reactions  . Amlodipine Besylate Swelling  . Hydrochlorothiazide     CAUSES GOUT FLARE UP    Patient Measurements: Height: 5\' 4"  (162.6 cm) Weight: 211 lb (95.7 kg) IBW/kg (Calculated) : 54.7 Heparin Dosing Weight: 75kg  Vital Signs: Temp: 98.8 F (37.1 C) (10/08 2320) Temp Source: Oral (10/08 2320) BP: 150/69 (10/08 2320) Pulse Rate: 50 (10/08 2320)  Labs: Recent Labs    09/25/18 2101 09/26/18 0009  HGB 13.1  --   HCT 40.4  --   PLT 239  --   CREATININE 2.14*  --   TROPONINI  --  0.06*    Estimated Creatinine Clearance: 26.7 mL/min (A) (by C-G formula based on SCr of 2.14 mg/dL (H)).   Medications:  Medications Prior to Admission  Medication Sig Dispense Refill Last Dose  . acetaminophen (TYLENOL) 325 MG tablet Take 650 mg by mouth every 6 (six) hours as needed for mild pain, moderate pain or headache.   Past Week at Unknown time  . allopurinol (ZYLOPRIM) 300 MG tablet Take 300 mg by mouth every other day.    09/25/2018 at Unknown time  . dextromethorphan (DELSYM) 30 MG/5ML liquid Take 30 mg by mouth as needed for cough.   09/25/2018 at Unknown time  . furosemide (LASIX) 40 MG tablet Take 40 mg by mouth 2 (two) times daily.  5 09/25/2018 at Unknown time  . GLIPIZIDE XL 10 MG 24 hr tablet Take 10 mg by mouth daily.   5 09/25/2018 at Unknown time  . guaiFENesin-codeine 100-10 MG/5ML syrup Take 5 mLs by mouth 3 (three) times daily as needed for cough.   09/24/2018 at Unknown time  . hydrALAZINE (APRESOLINE) 50 MG tablet Take 1 tablet (50 mg total) by mouth 3 (three) times daily. 270 tablet 3 09/25/2018 at Unknown time  . lansoprazole (PREVACID) 30 MG capsule Take 30 mg by mouth daily.  4 09/25/2018 at Unknown time  . levothyroxine (SYNTHROID, LEVOTHROID) 88 MCG tablet Take 88 mcg by mouth daily before breakfast.   09/25/2018 at Unknown time  . lisinopril  (PRINIVIL,ZESTRIL) 40 MG tablet Take 40 mg by mouth daily.   09/25/2018 at Unknown time  . metoprolol (TOPROL-XL) 200 MG 24 hr tablet Take 200 mg by mouth daily.   09/25/2018 at 1000  . Multiple Vitamin (MULTIVITAMIN) tablet Take 2 tablets by mouth daily.    09/25/2018 at Unknown time  . potassium chloride (K-DUR) 10 MEQ tablet Take 1 tablet (10 mEq total) by mouth 2 (two) times daily. 180 tablet 3 09/25/2018 at Unknown time  . pravastatin (PRAVACHOL) 20 MG tablet Take 20 mg by mouth daily.   09/24/2018 at Unknown time   Scheduled:  . [START ON 09/27/2018] allopurinol  300 mg Oral QODAY  . aspirin      . enoxaparin (LOVENOX) injection  30 mg Subcutaneous Q24H  . hydrALAZINE  50 mg Oral TID  . insulin aspart  0-9 Units Subcutaneous TID WC  . levothyroxine  88 mcg Oral QAC breakfast  . multivitamin with minerals  2 tablet Oral Daily  . pantoprazole  20 mg Oral Daily  . pravastatin  20 mg Oral q1800   Infusions:  . sodium chloride 100 mL/hr at 09/25/18 2340    Assessment: 72yo female presents after syncopal episode, pt w/ AKI and dehydration, also found to have elevated D-dimer, to begin heparin while awaiting PE studies.  Goal of Therapy:  Heparin level 0.3-0.7 units/ml Monitor platelets by anticoagulation protocol: Yes   Plan:  Will give heparin 3000 units IV bolus x1 followed by gtt at 1400 units/hr and monitor heparin levels and CBC.  Wynona Neat, PharmD, BCPS  09/26/2018,2:05 AM

## 2018-09-26 NOTE — Care Management Obs Status (Signed)
[facility] NOTIFICATION   Patient Details  Name: Jody Oneill MRN: [id number] Date of Birth: 1946/02/08   Medicare Observation Status Notification Given:  Yes    Sharin Mons, RN 09/26/2018, 8:03 AM

## 2018-09-26 NOTE — Progress Notes (Signed)
Patient refused CPAP.

## 2018-09-26 NOTE — Progress Notes (Signed)
ANTICOAGULATION CONSULT NOTE - Follow Up Consult  Pharmacy Consult for heparin Indication: r/o PE  Allergies  Allergen Reactions  . Amlodipine Besylate Swelling  . Hydrochlorothiazide     CAUSES GOUT FLARE UP    Patient Measurements: Height: 5\' 4"  (162.6 cm) Weight: 211 lb (95.7 kg) IBW/kg (Calculated) : 54.7 Heparin Dosing Weight: 75kg  Vital Signs: Temp: 98 F (36.7 C) (10/09 1239) Temp Source: Oral (10/09 1239) BP: 116/51 (10/09 1239) Pulse Rate: 52 (10/09 1239)  Labs: Recent Labs    09/25/18 2101 09/26/18 0009 09/26/18 0457 09/26/18 0957 09/26/18 1228  HGB 13.1  --  11.0*  --   --   HCT 40.4  --  33.5*  --   --   PLT 239  --  212  --   --   HEPARINUNFRC  --   --   --   --  0.73*  CREATININE 2.14*  --  1.89*  --   --   TROPONINI  --  0.06* 0.05* 0.04*  --     Estimated Creatinine Clearance: 30.2 mL/min (A) (by C-G formula based on SCr of 1.89 mg/dL (H)).     Assessment: 72yo female presents after syncopal episode, pt w/ AKI and dehydration, also found to have elevated D-dimer, continues on heparin   Goal of Therapy:  Heparin level 0.3-0.7 units/ml Monitor platelets by anticoagulation protocol: Yes   Plan:  Decrease heparin to 1350 units / hr Follow up AM labs if heparin not stopped  Thank you Anette Guarneri, PharmD [phone]  09/26/2018,12:50 PM

## 2018-09-26 NOTE — Progress Notes (Signed)
Patient having episodes of bradycardia while she closes her eyes to sleep,  HR dropping down to 20s and 30s. Dr. Hilbert Bible had been notified via pager Awaiting reponse.

## 2018-09-26 NOTE — Progress Notes (Addendum)
CRITICAL VALUE STICKER  CRITICAL VALUE: Troponin 0.06  RECEIVER (on-site recipient of call): Francelia Mclaren, [place] NOTIFIED: 09/26/2018 & 1:14 AM  MESSENGER (representative from lab): Raquel Sarna  MD NOTIFIED: Hal Hope, MD   TIME OF NOTIFICATION: paged at [place]: at 0116 - verbal order for 81mg  of PO aspirin, once now.

## 2018-09-26 NOTE — Progress Notes (Signed)
Patient a new admission for syncope. Arrives into the unit at 0405.  Alert and orientedx 4.  Patient was placed on progressive monitor skin assessment done by two RNs, vital signs  Stable with hR in the low to high 50s. Will continue to monitor.

## 2018-09-26 NOTE — Progress Notes (Signed)
Patient Demographics:    Jody Oneill, is a 72 y.o. female, DOB - 1946/10/18, IDP:[id number]  Admit date - 09/25/2018   Admitting Physician Rise Patience, MD  Outpatient Primary MD for the patient is Darcus Austin, MD  LOS - 0   Chief Complaint  Patient presents with  . Chest Pain  . Loss of Consciousness        Subjective:    Jody Oneill today has no fevers, no emesis,  No chest pain, no further dizziness, 2 daughters at bedside, questions answered  Assessment  & Plan :    Principal Problem:   Syncope Active Problems:   Chronic diastolic CHF (congestive heart failure) (HCC)   Essential hypertension   Type 2 diabetes mellitus without complications ([place])   ARF (acute renal failure) (HCC)   Chest pain  Brief Summary Jody Oneill is a 72 y.o. female with history of chronic diastolic CHF last EF measured was in August 2018 showed EF of 55 to 60% with grade 2 diastolic dysfunction on Lasix, hypertension on metoprolol and hydralazine, hypothyroidism, hyperlipidemia, diabetes mellitus type 2 and chronic kidney disease had a syncopal episode admitted 09/25/18 with syncopal episode in the setting of dehydration/AKI and sinus bradycardia  Plan:-  1)Syncope with Elevated D-dimer--- VQ scan low probability for PE, low estimated Dopplers without DVT, okay to discontinue iv heparin, suspect multifactorial etiology for his syncope including dehydration in the setting of viral URI with poor oral intake compounded by Toprol-XL induced bradycardia (PTA she was taking Toprol-XL 200 mg daily)... Cardiology consult appreciated, echo from 08/07/2017 with preserved EF of 55 to 60%, grade 2 dCHF without significant aortic stenosis, Nuclear stress test from 08/08/2017 without reversible ischemia, EF on gated images was 65%  2)Symptomatic bradycardia--- overnight heart rate in the 30s, heart rate now up to 40s and  50s, PTA she was taking Toprol-XL 200 mg daily, continue to monitor on telemetry monitored unit, cardiology input appreciated, Toprol will have to be reduced.  TSH is 2.5, no significant nodular abnormalities  3)AKI----acute kidney injury on CKD stage -III     creatinine on admission=2.1  ,   baseline creatinine = 1.2 to 1.3    , creatinine is now= 1.89     , renally adjust medications, avoid nephrotoxic agents/dehydration/hypotension  4)HTN--- BP was running higher area after holding Toprol-XL, BP now improving with addition of Imdur, continue hydralazine  5)Hypothyroidism-stable, TSH is 2.5, continue levothyroxine 88 mcg daily  6)Obesity/OSA--- CPAP nightly  Disposition/Need for in-Hospital Stay- patient unable to be discharged at this time due to symptomatic bradycardia in the patient with syncope with heart rate in the 30s at times, also acute kidney injury requiring further IV hydration  Code Status : full  Disposition Plan  : home  Consults  :  cardiology  DVT Prophylaxis  :    Heparin sq - SCDs   Lab Results  Component Value Date   PLT 212 09/26/2018    Inpatient Medications  Scheduled Meds: . [START ON 09/27/2018] allopurinol  300 mg Oral QODAY  . hydrALAZINE  100 mg Oral TID  . insulin aspart  0-9 Units Subcutaneous TID WC  . isosorbide mononitrate  30 mg Oral Daily  . levothyroxine  88 mcg Oral  QAC breakfast  . multivitamin with minerals  2 tablet Oral Daily  . pantoprazole  20 mg Oral Daily  . pravastatin  20 mg Oral q1800   Continuous Infusions: . sodium chloride 100 mL/hr at 09/25/18 2340   PRN Meds:.acetaminophen **OR** acetaminophen, guaiFENesin-codeine, ondansetron **OR** ondansetron (ZOFRAN) IV    Anti-infectives (From admission, onward)   None        Objective:   Vitals:   09/26/18 0749 09/26/18 0900 09/26/18 1239 09/26/18 1613  BP: (!) 159/60 133/66 (!) 116/51 102/74  Pulse: (!) 44  (!) 52 (!) 43  Resp: 15  16 20   Temp: 98.1 F (36.7 C)  98  F (36.7 C) 98.1 F (36.7 C)  TempSrc: Oral  Oral Oral  SpO2: 99%  99% 100%  Weight:      Height:        Wt Readings from Last 3 Encounters:  09/25/18 95.7 kg  04/02/18 102 kg  12/24/17 102.5 kg    No intake or output data in the 24 hours ending 09/26/18 1716   Physical Exam Patient is examined daily including today on 09/26/18 , exams remain the same as of yesterday except that has changed   Gen:- Awake Alert,  In no apparent distress  HEENT:- [place].AT, No sclera icterus Neck-Supple Neck,No JVD,.  Lungs-  CTAB , good air movement CV- S1, S2 normal, bradycardic with heart rate in the 40s Abd-  +ve B.Sounds, Abd Soft, No tenderness,    Extremity/Skin:-Warm and dry, good pulses Psych-affect is appropriate, oriented x3 Neuro-no new focal deficits, no tremors   Data Review:   Micro Results Recent Results (from the past 240 hour(s))  MRSA PCR Screening     Status: None   Collection Time: 09/26/18  5:19 AM  Result Value Ref Range Status   MRSA by PCR NEGATIVE NEGATIVE Final    Comment:        The GeneXpert MRSA Assay (FDA approved for NASAL specimens only), is one component of a comprehensive MRSA colonization surveillance program. It is not intended to diagnose MRSA infection nor to guide or monitor treatment for MRSA infections. Performed at [facility], [place] [address], [place], [place] [address]     Radiology Reports Dg Chest 2 View  Result Date: 09/25/2018 CLINICAL DATA:  Dizziness tonight. [place] out of kitchen chair. Mid chest pain. History of diabetes. Nonsmoker. EXAM: CHEST - 2 VIEW COMPARISON:  09/15/2010 FINDINGS: Mild cardiac enlargement. No pulmonary vascular congestion, edema, or consolidation. No blunting of costophrenic angles. No pneumothorax. Mediastinal contours appear intact. Degenerative changes in the spine. IMPRESSION: Mild cardiac enlargement. No evidence of active pulmonary disease. Electronically Signed   By: Lucienne Capers M.D.   On:  09/25/2018 21:24   Nm Pulmonary Perf And Vent  Result Date: 09/26/2018 CLINICAL DATA:  Chest pain.  Syncope. EXAM: NUCLEAR MEDICINE VENTILATION - PERFUSION LUNG SCAN VIEWS: Anterior, posterior, left lateral, right lateral, RPO, LPO, RAO, LAO-ventilation and perfusion RADIOPHARMACEUTICALS:  30.0 mCi of Tc-73m DTPA aerosol inhalation and 4.0 mCi Tc61m-MAA IV COMPARISON:  Chest radiograph September 25, 2018 FINDINGS: Ventilation: Radiotracer uptake on the ventilation study is homogeneous and symmetric bilaterally. No ventilation defects are evident. Perfusion: Radiotracer uptake on the perfusion study is homogeneous and symmetric bilaterally. No evident perfusion defects. Heart is noted to be mildly enlarged. IMPRESSION: No appreciable ventilation or perfusion defects. This study constitutes a very low probability of pulmonary embolus. Heart noted to be mildly enlarged. Electronically Signed   By: Gwyndolyn Saxon  Jasmine December III M.D.   On: 09/26/2018 11:39     CBC Recent Labs  Lab 09/25/18 2101 09/26/18 0457  WBC 11.9* 11.2*  HGB 13.1 11.0*  HCT 40.4 33.5*  PLT 239 212  MCV 87.4 86.3  MCH 28.4 28.4  MCHC 32.4 32.8  RDW 14.0 13.7    Chemistries  Recent Labs  Lab 09/25/18 2101 09/26/18 0457  NA 133* 137  K 3.9 4.0  CL 96* 99  CO2 25 24  GLUCOSE 209* 219*  BUN 53* 54*  CREATININE 2.14* 1.89*  CALCIUM 10.1 9.1   ------------------------------------------------------------------------------------------------------------------ No results for input(s): CHOL, HDL, LDLCALC, TRIG, CHOLHDL, LDLDIRECT in the last 72 hours.  No results found for: HGBA1C ------------------------------------------------------------------------------------------------------------------ Recent Labs    09/26/18 0009  TSH 2.564   ------------------------------------------------------------------------------------------------------------------ No results for input(s): VITAMINB12, FOLATE, FERRITIN, TIBC, IRON, RETICCTPCT  in the last 72 hours.  Coagulation profile No results for input(s): INR, PROTIME in the last 168 hours.  Recent Labs    09/26/18 0009  DDIMER 10.71*    Cardiac Enzymes Recent Labs  Lab 09/26/18 0009 09/26/18 0457 09/26/18 0957  TROPONINI 0.06* 0.05* 0.04*   ------------------------------------------------------------------------------------------------------------------ No results found for: BNP   Roxan Hockey M.D on 09/26/2018 at 5:16 PM  Pager---[phone] Go to [email or web address] - password [email or web address] for contact info  [facility] - Office  [phone]

## 2018-09-26 NOTE — Progress Notes (Signed)
*  Preliminary Results* Bilateral lower extremity venous duplex completed. Bilateral lower extremities are negative for deep vein thrombosis. There is no evidence of Baker's cyst bilaterally.  09/26/2018 12:39 PM Jody Oneill Dawna Part

## 2018-09-26 NOTE — Consult Note (Addendum)
Cardiology Consultation:   Patient ID: Jody Oneill MRN: [id number]; DOB: May 27, 1946  Admit date: 09/25/2018 Date of Consult: 09/26/2018  Primary Care Provider: Darcus Austin, MD Primary Cardiologist: Mertie Moores, MD    Patient Profile:   Jody Oneill is a 72 y.o. female with a hx of chronic diastolic dysfunction, RBBB, OSA, T2DM, HTN, CKD, recent URI and subsequent decreased PO intake, who is being seen today for the evaluation of syncope, in the setting of bradycardia, dehydration and AKI, at the request of Dr. Denton Brick, Internal Medicine.  History of Present Illness:   72 y.o. female with a hx of chronic diastolic congestive heart failure,RBBB, obstructive sleep apnea, type 2 diabetes mellitus, hypertension, chronic kidney disease. She underwent w/u for dyspnea in 2018. 2D echoshowed normal LVEF and moderate diastolic dysfunction. NST 07/2017 showed no evidence of ischemia or infarction and EF normal by nuclear study at 65%. Last OV was with Dr. Acie Fredrickson 04/02/18. BP was elevated at 182/74. Pt noted high sodium diet (eating out frequently). She was advised to reduce salt intake. Her hydralazine was increased to 50 mg TID. Lasix 20 mg BID was continued and supplemental K was added, 10 mEq BID with plan to get repeat BMP 1 week later and clinic f/u again in 6 months. F/u BMP showed stable SCr at 1.2 and normal K, but elevated BG level in the 200s. Pt advised to f/u with her PCP.   Pt presented to the [facility] ED on 09/25/18 after syncopal spell at home. Prior to event, pt had a several day history of feeling unwell. She had had URI like symptoms over the past 4 days. She went to an urgent care this past weekend and was told that her symptoms were likely viral. Supportive care advised. Pt also with poor PO intake recently given sore throat.   Yesterday, pt had slight CP earlier in the day. Felt like chest pressure and indigestion. Later on in the evening, pt was having dinner with family at the table.  Did not feel well. She felt dizzy and rested her head on the table then fell out of the chair onto the floor. Event witnessed by daughter. Pt was briefly unconscious for about 10 sec. There was no seizure like activity and no incontinence. Pt noted feeling recurrent CP following incident. Pt was brought into the ED for evaluation.   In the ED, pt was CP free. Labs were c/w AKI with jump in SCr from a baseline of 1.2 to now 2.1. BUN 53. EKG showed sinus bradycardia in the 25s with old RBBB (present since 07/2017). Pt was given a bolus of IVFs in the ED and continued on infusion. She was admitted by IM for syncope and AKI and further w/u. D-dimer elevated at 10, however V/Q low probability for PE. LE venous dopplers pending. Telemetry shows sinus brady, in the 50s. Lowest HR was 39 bpm, but only brief period. Pt denies any recurrent syncope or near syncope. No recurrent CP since admit. She denies any recent exertional CP.   Past Medical History:  Diagnosis Date  . Chronic kidney disease   . Fatty liver   . GERD (gastroesophageal reflux disease)   . Gout   . History of echocardiogram    Echo 8/18: mild conc LVH, EF 55-60, no RWMA, Gr 2 DD, mod LAE  . History of nuclear stress test    Nuclear stress test 8/18: EF 65, no ischemia or scar, normal study  . Hypertension   . Hypothyroid   .  Obesity    fatty liver by [place]  . OSA on CPAP   . Osteoarthritis   . Type 2 diabetes mellitus (Jody Oneill)     Past Surgical History:  Procedure Laterality Date  . BIOPSY BREAST Left    many years ago     Home Medications:  Prior to Admission medications   Medication Sig Start Date End Date Taking? Authorizing Provider  acetaminophen (TYLENOL) 325 MG tablet Take 650 mg by mouth every 6 (six) hours as needed for mild pain, moderate pain or headache.   Yes [provider]  allopurinol (ZYLOPRIM) 300 MG tablet Take 300 mg by mouth every other day.    Yes [provider]  dextromethorphan (DELSYM) 30  MG/5ML liquid Take 30 mg by mouth as needed for cough.   Yes [provider]  furosemide (LASIX) 40 MG tablet Take 40 mg by mouth 2 (two) times daily. 08/21/18  Yes [provider]  GLIPIZIDE XL 10 MG 24 hr tablet Take 10 mg by mouth daily.  06/01/15  Yes [provider]  guaiFENesin-codeine 100-10 MG/5ML syrup Take 5 mLs by mouth 3 (three) times daily as needed for cough.   Yes [provider]  hydrALAZINE (APRESOLINE) 50 MG tablet Take 1 tablet (50 mg total) by mouth 3 (three) times daily. 04/02/18  Yes Nahser, Wonda Cheng, MD  lansoprazole (PREVACID) 30 MG capsule Take 30 mg by mouth daily. 11/18/14  Yes [provider]  levothyroxine (SYNTHROID, LEVOTHROID) 88 MCG tablet Take 88 mcg by mouth daily before breakfast.   Yes [provider]  lisinopril (PRINIVIL,ZESTRIL) 40 MG tablet Take 40 mg by mouth daily.   Yes [provider]  metoprolol (TOPROL-XL) 200 MG 24 hr tablet Take 200 mg by mouth daily.   Yes [provider]  Multiple Vitamin (MULTIVITAMIN) tablet Take 2 tablets by mouth daily.    Yes [provider]  potassium chloride (K-DUR) 10 MEQ tablet Take 1 tablet (10 mEq total) by mouth 2 (two) times daily. 04/02/18  Yes Nahser, Wonda Cheng, MD  pravastatin (PRAVACHOL) 20 MG tablet Take 20 mg by mouth daily.   Yes [provider]    Inpatient Medications: Scheduled Meds: . [START ON 09/27/2018] allopurinol  300 mg Oral QODAY  . hydrALAZINE  100 mg Oral TID  . insulin aspart  0-9 Units Subcutaneous TID WC  . isosorbide mononitrate  30 mg Oral Daily  . levothyroxine  88 mcg Oral QAC breakfast  . multivitamin with minerals  2 tablet Oral Daily  . pantoprazole  20 mg Oral Daily  . pravastatin  20 mg Oral q1800   Continuous Infusions: . sodium chloride 100 mL/hr at 09/25/18 2340   PRN Meds: acetaminophen **OR** acetaminophen, guaiFENesin-codeine, ondansetron **OR** ondansetron (ZOFRAN) IV  Allergies:      Allergies  Allergen Reactions  . Amlodipine Besylate Swelling  . Hydrochlorothiazide     CAUSES GOUT FLARE UP    Social History:   Social History   Socioeconomic History  . Marital status: Widowed    Spouse name: Not on file  . Number of children: Not on file  . Years of education: Not on file  . Highest education level: Not on file  Occupational History  . Not on file  Social Needs  . Financial resource strain: Not on file  . Food insecurity:    Worry: Not on file    Inability: Not on file  . Transportation needs:    Medical: Not on  file    Non-medical: Not on file  Tobacco Use  . Smoking status: Never Smoker  . Smokeless tobacco: Never Used  Substance and Sexual Activity  . Alcohol use: No    Alcohol/week: 0.0 standard drinks    Frequency: Never  . Drug use: No  . Sexual activity: Not on file  Lifestyle  . Physical activity:    Days per week: Not on file    Minutes per session: Not on file  . Stress: Not on file  Relationships  . Social connections:    Talks on phone: Not on file    Gets together: Not on file    Attends religious service: Not on file    Active member of club or organization: Not on file    Attends meetings of clubs or organizations: Not on file    Relationship status: Not on file  . Intimate partner violence:    Fear of current or ex partner: Not on file    Emotionally abused: Not on file    Physically abused: Not on file    Forced sexual activity: Not on file  Other Topics Concern  . Not on file  Social History Narrative   Tobacco use  Cigarettes: Never smoked ,Tobacco history updated 10/23/2014   Alcohol  : yes occasionally . Caffeine  Yes, rare no recreational drugs .   Exercise Yes   -three times a week water aerobics   Occupation unemployed /retired   Martial status single widowed (2006)    2 daughters    Seat belt use - yes       Family History:    Family History  Problem Relation Age of Onset  . Lung cancer Mother   .  Other Father        meningitis  . Diabetes Father   . Breast cancer Maternal Aunt   . Breast cancer Maternal Aunt      ROS:  Please see the history of present illness.   All other ROS reviewed and negative.     Physical Exam/Data:   Vitals:   09/26/18 0615 09/26/18 0749 09/26/18 0900 09/26/18 1239  BP: (!) 143/62 (!) 159/60 133/66 (!) 116/51  Pulse: (!) 49 (!) 44  (!) 52  Resp: 20 15  16   Temp:  98.1 F (36.7 C)  98 F (36.7 C)  TempSrc:  Oral  Oral  SpO2: 98% 99%  99%  Weight:      Height:       No intake or output data in the 24 hours ending 09/26/18 1503 Filed Weights   09/25/18 2105  Weight: 95.7 kg   Body mass index is 36.22 kg/m.  General:  Well nourished, well developed, in no acute distress HEENT: normal Lymph: no adenopathy Neck: no JVD Endocrine:  No thryomegaly Vascular: No carotid bruits; FA pulses 2+ bilaterally without bruits  Cardiac:  normal S1, S2; RRR; no murmur  Lungs:  clear to auscultation bilaterally, no wheezing, rhonchi or rales  Abd: soft, nontender, no hepatomegaly  Ext: no edema Musculoskeletal:  No deformities, BUE and BLE strength normal and equal Skin: warm and dry  Neuro:  CNs 2-12 intact, no focal abnormalities noted Psych:  Normal affect   EKG:  The EKG was personally reviewed and demonstrates:  Sinus brady 50s, RBBB (old).  Telemetry:  Telemetry was personally reviewed and demonstrates:  Sinus brady, mainly in the 50s but had HR in there upper 30s, briefly  Relevant CV Studies: 2D Echo  07/2017 Study Conclusions  - Left ventricle: The cavity size was normal. There was mild   concentric hypertrophy. Systolic function was normal. The   estimated ejection fraction was in the range of 55% to 60%. Wall   motion was normal; there were no regional wall motion   abnormalities. Features are consistent with a pseudonormal left   ventricular filling pattern, with concomitant abnormal relaxation   and increased filling pressure (grade  2 diastolic dysfunction). - Left atrium: The atrium was moderately dilated.  ___________________________  NST 08/08/18 Study Highlights     The left ventricular ejection fraction is normal (55-65%).  Nuclear stress EF: 65%.  There was no ST segment deviation noted during stress.  The study is normal.   1. EF 65%, normal wall motion.  2. No evidence for ischemia or infarction by perfusion images.   Normal study.      Laboratory Data:  Chemistry Recent Labs  Lab 09/25/18 2101 09/26/18 0457  NA 133* 137  K 3.9 4.0  CL 96* 99  CO2 25 24  GLUCOSE 209* 219*  BUN 53* 54*  CREATININE 2.14* 1.89*  CALCIUM 10.1 9.1  GFRNONAA 22* 25*  GFRAA 25* 29*  ANIONGAP 12 14    No results for input(s): PROT, ALBUMIN, AST, ALT, ALKPHOS, BILITOT in the last 168 hours. Hematology Recent Labs  Lab 09/25/18 2101 09/26/18 0457  WBC 11.9* 11.2*  RBC 4.62 3.88  HGB 13.1 11.0*  HCT 40.4 33.5*  MCV 87.4 86.3  MCH 28.4 28.4  MCHC 32.4 32.8  RDW 14.0 13.7  PLT 239 212   Cardiac Enzymes Recent Labs  Lab 09/26/18 0009 09/26/18 0457 09/26/18 0957  TROPONINI 0.06* 0.05* 0.04*    Recent Labs  Lab 09/25/18 2109  TROPIPOC 0.00    BNPNo results for input(s): BNP, PROBNP in the last 168 hours.  DDimer  Recent Labs  Lab 09/26/18 0009  DDIMER 10.71*    Radiology/Studies:  Dg Chest 2 View  Result Date: 09/25/2018 CLINICAL DATA:  Dizziness tonight. [place] out of kitchen chair. Mid chest pain. History of diabetes. Nonsmoker. EXAM: CHEST - 2 VIEW COMPARISON:  09/15/2010 FINDINGS: Mild cardiac enlargement. No pulmonary vascular congestion, edema, or consolidation. No blunting of costophrenic angles. No pneumothorax. Mediastinal contours appear intact. Degenerative changes in the spine. IMPRESSION: Mild cardiac enlargement. No evidence of active pulmonary disease. Electronically Signed   By: Lucienne Capers M.D.   On: 09/25/2018 21:24   Nm Pulmonary Perf And Vent  Result Date:  09/26/2018 CLINICAL DATA:  Chest pain.  Syncope. EXAM: NUCLEAR MEDICINE VENTILATION - PERFUSION LUNG SCAN VIEWS: Anterior, posterior, left lateral, right lateral, RPO, LPO, RAO, LAO-ventilation and perfusion RADIOPHARMACEUTICALS:  30.0 mCi of Tc-49m DTPA aerosol inhalation and 4.0 mCi Tc24m-MAA IV COMPARISON:  Chest radiograph September 25, 2018 FINDINGS: Ventilation: Radiotracer uptake on the ventilation study is homogeneous and symmetric bilaterally. No ventilation defects are evident. Perfusion: Radiotracer uptake on the perfusion study is homogeneous and symmetric bilaterally. No evident perfusion defects. Heart is noted to be mildly enlarged. IMPRESSION: No appreciable ventilation or perfusion defects. This study constitutes a very low probability of pulmonary embolus. Heart noted to be mildly enlarged. Electronically Signed   By: Lowella Grip III M.D.   On: 09/26/2018 11:39    Assessment and Plan:   Jody Oneill is a 72 y.o. female with a hx of chronic diastolic dysfunction, RBBB, OSA, T2DM, HTN, CKD, recent URI and subsequent decreased PO intake, who is being seen today for the  evaluation of syncope, in the setting of bradycardia, dehydration and AKI, at the request of Dr. Denton Brick, Internal Medicine.  1. ? Syncope:  Pt with near syncopal episode may have been secondary to dehydration, secondary to decreased PO intake in the setting of recent URI, although her bradycardia may have also contributed. BMP c/w AKI with SCr at 2.1 (basline 1.2) and elevated BUN at 53. Pt also on BID lasix as outpatient. She was noted to have sinus bradycardia on EKG but mild, in the 50s. Telemetry shows brief period of sinus brady in the 30s. She has had recent CP. Troponins are mildly elevated but flat trend 0.06>>0.05>>0.04. Suspect demand ischemia. Enzyme elevation is not c/w ACS. She had a NST in 2018 that was low risk and negative for ischemia. She had an Echo in 07/2017 that showed normal LVEF and no valvular  disease. D-dimer elevated at 10.71, however V/Q scan was very low probability for PE. LE venous doppler pending. Given recent symptoms, mildly elevated troponin and risk factors, may consider repeat NST to r/o ischemia, or coronary CTA if renal function improves. Recommend reducing metoprolol dose given her bradycardia.   2. Sinus Bradycardia: HR in the 50 but on a high dose of oral  blocker as an outpatient, metoprolol succinate 200 mg daily (held on admit). Also observed to have HR in the upper 30s earlier on tele, but briefly. K normal on admit. She has hypothyroidism, on levothyroxine, however TSH WNL. Also OSA but has been compliant w/ CPAP.  No recurrent syncope or near syncope since admit. Recommend reducing dose of BB. Titrate down slowly. We can f/u and reassess HR as outpatient. May also consider outpatient monitor. Will defer to MD.   3. AKI: secondary to poor PO intake. Home lasix on hold. Getting IVFs. Renal function improving. Further management per IM.      For questions or updates, please contact [place] Please consult [email or web address] for contact info under     Signed, Jody Jester, PA-C  09/26/2018 3:03 PM   Pt seen and examined   I agree with fndings of B Simmons above  Pt is a 72 yo who reports a near syncopal spell   Had been doing good until this past week   Has a hsitory of diastolic dysfunction, HTN, OSA, DM    One week ago got a URI  Admits to not eating or drinking as much    At home had near syncope    Alos had chest pressure earlier in the day   Dizzy   Did not feel well.  Family said she had full syncope    In ED she was given IV fluids    Initial Cr 2.1  EKG with SB 50s   RBBB   Tele without signif bradycardia or pouses   She is currently off of b blocker   ON exam, pt with cough   Appears comfortable in bed Neck:  JVP normal LUngs  Mild rhonchi Cardiac RRR   No murmurs    Abd   Supple   Obese   Nontender   No definite masses Ext with trivial  edema    Impression     Syncope/?near syncope Episode most likely due to poor po intake / dehydration in setting of antihypertensive use ( B Blocker) Her HR has been slower at times   She is off of meds  I would recomm Continuing telemetry   She is now on hydralazine   As tid drug  may not use in long term   Off B Blicker   Watch  May get some rebound Pt has received IV fluids   Follow  Po intake  Get BMET in AM  I am not convinced of any angina/ischemic symptoms     Will continue to follow with you  Dorris Carnes

## 2018-09-27 LAB — BASIC METABOLIC PANEL
Anion gap: 7 (ref 5–15)
BUN: 39 mg/dL — AB (ref 8–23)
CALCIUM: 9.1 mg/dL (ref 8.9–10.3)
CO2: 24 mmol/L (ref 22–32)
CREATININE: 1.39 mg/dL — AB (ref 0.44–1.00)
Chloride: 107 mmol/L (ref 98–111)
GFR calc non Af Amer: 37 mL/min — ABNORMAL LOW (ref >60)
GFR, EST AFRICAN AMERICAN: 43 mL/min — AB (ref >60)
GLUCOSE: 115 mg/dL — AB (ref 70–99)
Potassium: 4 mmol/L (ref 3.5–5.1)
Sodium: 138 mmol/L (ref 135–145)

## 2018-09-27 LAB — GLUCOSE, CAPILLARY: Glucose-Capillary: 122 mg/dL — ABNORMAL HIGH (ref 70–99)

## 2018-09-27 MED ORDER — ISOSORBIDE MONONITRATE ER 30 MG PO TB24: 30.0000 mg | ORAL_TABLET | Freq: Every day | ORAL | 1 refills | Status: DC

## 2018-09-27 MED ORDER — METOPROLOL SUCCINATE ER 50 MG PO TB24: 50.0000 mg | ORAL_TABLET | Freq: Every day | ORAL | 11 refills | Status: DC

## 2018-09-27 MED ORDER — HYDRALAZINE HCL 100 MG PO TABS: 100.0000 mg | ORAL_TABLET | Freq: Three times a day (TID) | ORAL | 2 refills | Status: DC

## 2018-09-27 MED ORDER — FUROSEMIDE 40 MG PO TABS: 40.0000 mg | ORAL_TABLET | Freq: Every day | ORAL | 5 refills | Status: DC

## 2018-09-27 MED ORDER — LISINOPRIL 40 MG PO TABS: 40.0000 mg | ORAL_TABLET | Freq: Every day | ORAL | 1 refills | Status: DC

## 2018-09-27 MED FILL — METOPROLOL SUCC ER 50 MG TA: 50 | 30 days supply | Qty: 30 | Fill #0 | Status: TO

## 2018-09-27 MED FILL — hydrALAZINE HCL 100 MG TABS: 100 | 30 days supply | Qty: 90 | Fill #0 | Status: TO

## 2018-09-27 MED FILL — ISOSORBIDE MN ER 30 MG TAB: 30 | 30 days supply | Qty: 30 | Fill #0 | Status: TO

## 2018-09-27 NOTE — Consult Note (Signed)
            [facility] CM Primary Care Navigator  09/27/2018  Jody Oneill 09-13-1946 [id number]   Went to seepatient at the bedside to identify possible discharge needs butshewasalreadydischargedhomeper staff.  Per MD note,patient had beenadmittedfor chest pain and syncope (dehydration, acute kidney injury, chronic diastolic congestive HF, HTN)  Primary care provider's office is listed as providing transition of care (TOC) follow-up.  Patient has discharge instruction to follow-up withcardiology on 10/01/18.  Primary care provider's office called [name]) to notify of patient's health issues needing close follow-up and made aware to refer patient to [facility] care management as deemed necessary and appropriate for services.   For additional questions please contact:  Edwena Felty A. Lacresha Fusilier, BSN, RN-BC [facility] PRIMARY CARE Navigator Cell: [phone]

## 2018-09-27 NOTE — Discharge Summary (Signed)
Jody Oneill, is a 72 y.o. female  DOB August 19, 1946  MRN [id number].  Admission date:  09/25/2018  Admitting Physician  Rise Patience, MD  Discharge Date:  09/30/2018   Primary MD  Darcus Austin, MD  Recommendations for primary care physician for things to follow:   1)Very low-salt diet advised 2)Weigh yourself daily, call if you gain more than 3 pounds in 1 day or more than 5 pounds in 1 week as your diuretic medications may need to be adjusted 3)Reduce Metoprolol/Toprol-XL  to 50 mg daily  From 200 mg daily, first dose 09/28/2018 4)Follow-up with Cardiologist on Monday 10/01/18 5) repeat BMP/kidney and electrolyte tests with your doctor on Monday, 10/01/2018 6) restart lisinopril at 40 mg daily on Sunday 09/30/2018 7) reduce Lasix to 40 mg daily starting Friday 09/28/2018 8) check your blood pressure and heart rate once to twice a day and keep a diary, take the blood pressure heart rate diary with you to your next appointment with your doctor on 10/01/2018  Admission Diagnosis  Dehydration [E86.0] AKI (acute kidney injury) ([place]) [N17.9] Syncope, unspecified syncope type [R55] Chest pain, unspecified type [R07.9] Syncope [R55]   Discharge Diagnosis  Dehydration [E86.0] AKI (acute kidney injury) ([place]) [N17.9] Syncope, unspecified syncope type [R55] Chest pain, unspecified type [R07.9] Syncope [R55]   Principal Problem:   Syncope Active Problems:   Chronic diastolic CHF (congestive heart failure) ([place])   Essential hypertension   Type 2 diabetes mellitus without complications ([place])   ARF (acute renal failure) ([place])   Chest pain      Past Medical History:  Diagnosis Date  . Chronic kidney disease   . Fatty liver   . GERD (gastroesophageal reflux disease)   . Gout   . History of echocardiogram    Echo 8/18: mild conc LVH, EF 55-60, no RWMA, Gr 2 DD, mod LAE  . History of nuclear  stress test    Nuclear stress test 8/18: EF 65, no ischemia or scar, normal study  . Hypertension   . Hypothyroid   . Obesity    fatty liver by [place]  . OSA on CPAP   . Osteoarthritis   . Type 2 diabetes mellitus ([place])     Past Surgical History:  Procedure Laterality Date  . BIOPSY BREAST Left    many years ago     HPI  from the history and physical done on the day of admission:    Chief Complaint: Loss of consciousness and chest pain.  HPI: Jody Oneill is a 72 y.o. female with history of chronic diastolic CHF last EF measured was in August 2018 showed EF of 55 to 60% with grade 2 diastolic dysfunction on Lasix, hypertension on metoprolol and hydralazine, hypothyroidism, hyperlipidemia, diabetes mellitus type 2 and chronic kidney disease had a syncopal episode while having dinner last evening.  Patient initially rested onto the table following which patient fell onto the floor but did not hit her head and was witnessed by patient's daughter.  As per the family  patient lost consciousness very briefly for less than 10 seconds and regained spontaneously.  Did not have any seizure-like activities or incontinence of urine or bowel.  Following the fall patient had some chest pain across the chest which resolved.  Patient also complained of chest pain early in the morning.  At the time patient felt like a chest pressure and indigestion.  Patient states he has not been eating well with upper respiratory tract symptoms over the last 3 to 4 days and had gone to urgent care center on Saturday and was told she had a viral prodrome and was sent treated symptomatically.  She was not eating well because of sore throat-like feeling.  Denies any nausea vomiting or abdominal pain.  ED Course: In the ER patient was chest pain-free.  Labs revealed acute renal failure with creatinine worsening from 1.2 in April 2.1.  Patient was given fluid bolus in the ER and continuous infusion.  EKG was showing sinus  bradycardia.  Admitted for syncope likely could be from dehydration or bradycardia.   Hospital Course:   Brief Summary Ennifer Harston a 72 y.o.femalewithhistory of chronic diastolic CHF last EF measured was in August 2018 showed EF of 55 to 60% with grade 2 diastolic dysfunction on Lasix, hypertension on metoprolol and hydralazine, hypothyroidism, hyperlipidemia, diabetes mellitus type 2 and chronic kidney disease had a syncopal episode admitted 09/25/18 with syncopal episode in the setting of dehydration/AKI and sinus bradycardia  Plan:-   1)Syncope with Elevated D-dimer--- VQ scan low probability for PE, lower extremity Dopplers without DVT, we discontinued iv heparin, suspect multifactorial etiology for her syncope including dehydration in the setting of viral URI with poor oral intake compounded by Toprol-XL induced bradycardia (PTA she was taking Toprol-XL 200 mg daily)... Cardiology consult appreciated, echo from 08/07/2017 with preserved EF of 55 to 60%, grade 2 dCHF without significant aortic stenosis, Nuclear stress test from 08/08/2017 without reversible ischemia, EF on gated images was 65%  2)Symptomatic bradycardia--- during hospital stay heart rate in the 30s,  PTA she was taking Toprol-XL 200 mg daily,  heart rate is now up to the 60s and 70s after holding Toprol-XL for the last day or som  cardiology input appreciated,  okay to discharge on Toprol-XL 50 mg daily,  TSH is 2.5,    3)AKI----acute kidney injury on CKD stage -III     creatinine on admission=2.1  ,   baseline creatinine = 1.2 to 1.3    , creatinine is now= 1.39     , renally adjust medications, avoid nephrotoxic agents/dehydration/hypotension  4)HTN--- BP was running higher area after holding Toprol-XL, Toprol XL to be restarted at 50 mg daily BP medications adjusted, please see discharge medication list  5)Hypothyroidism-stable, TSH is 2.5, continue levothyroxine 88 mcg daily  6)Obesity/OSA--- CPAP  nightly  Code Status : full  Disposition Plan  : home  Consults  :  cardiology  Discharge Condition: stable  Follow UP----follow-up with cardiology on 10/01/2018 follow-up with PCP as previously advised  Diet and Activity recommendation:  As advised  Discharge Instructions    Discharge Instructions    Call MD for:  difficulty breathing, headache or visual disturbances   Complete by:  As directed    Call MD for:  persistant dizziness or light-headedness   Complete by:  As directed    Call MD for:  persistant nausea and vomiting   Complete by:  As directed    Call MD for:  severe uncontrolled pain   Complete  by:  As directed    Call MD for:  temperature >100.4   Complete by:  As directed    Diet - low sodium heart healthy   Complete by:  As directed    Diet - low sodium heart healthy   Complete by:  As directed    Discharge instructions   Complete by:  As directed    1)Very low-salt diet advised 2)Weigh yourself daily, call if you gain more than 3 pounds in 1 day or more than 5 pounds in 1 week as your diuretic medications may need to be adjusted 3)Reduce Metoprolol/Toprol-XL  to 50 mg daily  From 200 mg daily, first dose 09/28/2018 4)Follow-up with Cardiologist on Monday 10/01/18 5) repeat BMP/kidney and electrolyte tests with your doctor on Monday, 10/01/2018 6) restart lisinopril at 40 mg daily on Sunday 09/30/2018 7) reduce Lasix to 40 mg daily starting Friday 09/28/2018 8) check your blood pressure and heart rate once to twice a day and keep a diary, take the blood pressure heart rate diary with you to your next appointment with your doctor on 10/01/2018   Increase activity slowly   Complete by:  As directed    Increase activity slowly   Complete by:  As directed        Discharge Medications     Allergies as of 09/27/2018      Reactions   Amlodipine Besylate Swelling   Hydrochlorothiazide    CAUSES GOUT FLARE UP      Medication List    TAKE these  medications   acetaminophen 325 MG tablet Commonly known as:  TYLENOL Take 650 mg by mouth every 6 (six) hours as needed for mild pain, moderate pain or headache. Notes to patient:  Take 650mg  by mouth every 6 (six) hours as needed for mild pain, moderate pain or headache   allopurinol 300 MG tablet Commonly known as:  ZYLOPRIM Take 300 mg by mouth every other day. Notes to patient:  Take 300mg  by mouth every other day.   DELSYM 30 MG/5ML liquid Generic drug:  dextromethorphan Take 30 mg by mouth as needed for cough.   furosemide 40 MG tablet Commonly known as:  LASIX Take 1 tablet (40 mg total) by mouth daily. What changed:  when to take this   GLIPIZIDE XL 10 MG 24 hr tablet Generic drug:  glipiZIDE Take 10 mg by mouth daily.   guaiFENesin-codeine 100-10 MG/5ML syrup Take 5 mLs by mouth 3 (three) times daily as needed for cough. Notes to patient:  Take 1mls by mouth 3(three) times daily as needed for cough   hydrALAZINE 100 MG tablet Commonly known as:  APRESOLINE Take 1 tablet (100 mg total) by mouth 3 (three) times daily. What changed:    medication strength  how much to take   isosorbide mononitrate 30 MG 24 hr tablet Commonly known as:  IMDUR Take 1 tablet (30 mg total) by mouth daily.   lansoprazole 30 MG capsule Commonly known as:  PREVACID Take 30 mg by mouth daily.   levothyroxine 88 MCG tablet Commonly known as:  SYNTHROID, LEVOTHROID Take 88 mcg by mouth daily before breakfast.   lisinopril 40 MG tablet Commonly known as:  PRINIVIL,ZESTRIL Take 1 tablet (40 mg total) by mouth daily.   metoprolol succinate 50 MG 24 hr tablet Commonly known as:  TOPROL-XL Take 1 tablet (50 mg total) by mouth daily. Take with or immediately following a meal. What changed:    medication strength  how  much to take  additional instructions   multivitamin tablet Take 2 tablets by mouth daily.   potassium chloride 10 MEQ tablet Commonly known as:  K-DUR Take 1  tablet (10 mEq total) by mouth 2 (two) times daily.   pravastatin 20 MG tablet Commonly known as:  PRAVACHOL Take 20 mg by mouth daily.      Major procedures and Radiology Reports - PLEASE review detailed and final reports for all details, in brief -   Dg Chest 2 View  Result Date: 09/25/2018 CLINICAL DATA:  Dizziness tonight. [place] out of kitchen chair. Mid chest pain. History of diabetes. Nonsmoker. EXAM: CHEST - 2 VIEW COMPARISON:  09/15/2010 FINDINGS: Mild cardiac enlargement. No pulmonary vascular congestion, edema, or consolidation. No blunting of costophrenic angles. No pneumothorax. Mediastinal contours appear intact. Degenerative changes in the spine. IMPRESSION: Mild cardiac enlargement. No evidence of active pulmonary disease. Electronically Signed   By: Lucienne Capers M.D.   On: 09/25/2018 21:24   Nm Pulmonary Perf And Vent  Result Date: 09/26/2018 CLINICAL DATA:  Chest pain.  Syncope. EXAM: NUCLEAR MEDICINE VENTILATION - PERFUSION LUNG SCAN VIEWS: Anterior, posterior, left lateral, right lateral, RPO, LPO, RAO, LAO-ventilation and perfusion RADIOPHARMACEUTICALS:  30.0 mCi of Tc-7m DTPA aerosol inhalation and 4.0 mCi Tc72m-MAA IV COMPARISON:  Chest radiograph September 25, 2018 FINDINGS: Ventilation: Radiotracer uptake on the ventilation study is homogeneous and symmetric bilaterally. No ventilation defects are evident. Perfusion: Radiotracer uptake on the perfusion study is homogeneous and symmetric bilaterally. No evident perfusion defects. Heart is noted to be mildly enlarged. IMPRESSION: No appreciable ventilation or perfusion defects. This study constitutes a very low probability of pulmonary embolus. Heart noted to be mildly enlarged. Electronically Signed   By: Lowella Grip III M.D.   On: 09/26/2018 11:39    Micro Results    Recent Results (from the past 240 hour(s))  MRSA PCR Screening     Status: None   Collection Time: 09/26/18  5:19 AM  Result Value Ref Range  Status   MRSA by PCR NEGATIVE NEGATIVE Final    Comment:        The GeneXpert MRSA Assay (FDA approved for NASAL specimens only), is one component of a comprehensive MRSA colonization surveillance program. It is not intended to diagnose MRSA infection nor to guide or monitor treatment for MRSA infections. Performed at [facility], [place] [address], [place], [place] [address]        Today   Subjective    Kamaryn Grimley today has no concerns, family at bedside, heart rate up to the 60s and 70s          Patient has been seen and examined prior to discharge   Objective   Blood pressure (!) 125/59, pulse (!) 43, temperature 97.7 F (36.5 C), temperature source Oral, resp. rate 18, height 5\' 4"  (1.626 m), weight 99.4 kg, SpO2 100 %.  Exam Gen:- Awake Alert,  In no apparent distress  HEENT:- [place].AT, No sclera icterus Neck-Supple Neck,No JVD,.  Lungs-  CTAB , Fair air movement CV- S1, S2 normal, Prior to discharge heart rate was up to 70 bpm Abd-  +ve B.Sounds, Abd Soft, No tenderness,    Extremity/Skin:- No  edema,   good pulses Psych-affect is appropriate, oriented x3 Neuro-no new focal deficits, no tremors   Data Review   CBC w Diff:  Lab Results  Component Value Date   WBC 11.2 (H) 09/26/2018   HGB 11.0 (L) 09/26/2018   HCT  33.5 (L) 09/26/2018   PLT 212 09/26/2018   LYMPHOPCT 33 12/24/2017   MONOPCT 10 12/24/2017   EOSPCT 4 12/24/2017   BASOPCT 1 12/24/2017    CMP:  Lab Results  Component Value Date   NA 138 09/27/2018   NA 141 04/17/2018   K 4.0 09/27/2018   CL 107 09/27/2018   CO2 24 09/27/2018   BUN 39 (H) 09/27/2018   BUN 25 04/17/2018   CREATININE 1.39 (H) 09/27/2018  .   Total Discharge time is about 33 minutes  Roxan Hockey M.D on 09/30/2018 at 5:04 PM  Pager---[phone]  Go to [email or web address] - [email or web address] for contact info  [facility] - Office  [phone]

## 2018-09-27 NOTE — Care Management Note (Signed)
Case Management Note  Patient Details  Name: [name] MRN: [id number] Date of Birth: September 21, 1946  Subjective/Objective:  Syncope. Hx of CHF, hypertension, hypothyroidism, hyperlipidemia, diabetes mellitus type 2 and chronic kidney disease. From home with daughter, [name]. Independent with ADL's , no DME needs.    Adriane Gabbert (Daughter)     [phone]        Action/Plan: Transition to home.  No needs indentified per NCM. Pt has transportation to home.  Expected Discharge Date:  09/27/18               Expected Discharge Plan:  Home/Self Care  In-House Referral:     Discharge planning Services  CM Consult  Post Acute Care Choice:    Choice offered to:     DME Arranged:  N/A DME Agency:  NA  HH Arranged:  NA HH Agency:  NA  Status of Service:  Completed, signed off  If discussed at [place] of Stay Meetings, dates discussed:    Additional Comments:  Sharin Mons, RN 09/27/2018, 11:11 AM

## 2018-09-27 NOTE — Discharge Instructions (Signed)
1)Very low-salt diet advised 2)Weigh yourself daily, call if you gain more than 3 pounds in 1 day or more than 5 pounds in 1 week as your diuretic medications may need to be adjusted 3)Reduce Metoprolol/Toprol-XL  to 50 mg daily  From 200 mg daily, first dose 09/28/2018 4)Follow-up with Cardiologist on Monday 10/01/18 5) repeat BMP/kidney and electrolyte tests with your doctor on Monday, 10/01/2018 6) restart lisinopril at 40 mg daily on Sunday 09/30/2018 7) reduce Lasix to 40 mg daily starting Friday 09/28/2018 8) check your blood pressure and heart rate once to twice a day and keep a diary, take the blood pressure heart rate diary with you to your next appointment with your doctor on 10/01/2018

## 2018-10-01 ENCOUNTER — Ambulatory Visit (INDEPENDENT_AMBULATORY_CARE_PROVIDER_SITE_OTHER): Payer: Medicare Other | Admitting: Physician Assistant

## 2018-10-01 ENCOUNTER — Encounter: Payer: Self-pay | Admitting: Physician Assistant

## 2018-10-01 VITALS — BP 142/70 | HR 60 | Ht 64.0 in | Wt 214.8 lb

## 2018-10-01 DIAGNOSIS — R001 Bradycardia, unspecified: Secondary | ICD-10-CM

## 2018-10-01 DIAGNOSIS — R079 Chest pain, unspecified: Secondary | ICD-10-CM

## 2018-10-01 DIAGNOSIS — R55 Syncope and collapse: Secondary | ICD-10-CM

## 2018-10-01 DIAGNOSIS — N183 Chronic kidney disease, stage 3 unspecified: Secondary | ICD-10-CM

## 2018-10-01 DIAGNOSIS — J069 Acute upper respiratory infection, unspecified: Secondary | ICD-10-CM

## 2018-10-01 DIAGNOSIS — I5032 Chronic diastolic (congestive) heart failure: Secondary | ICD-10-CM

## 2018-10-01 DIAGNOSIS — I10 Essential (primary) hypertension: Secondary | ICD-10-CM | POA: Diagnosis not present

## 2018-10-01 LAB — BASIC METABOLIC PANEL
BUN / CREAT RATIO: 19 (ref 12–28)
BUN: 27 mg/dL (ref 8–27)
CO2: 22 mmol/L (ref 20–29)
CREATININE: 1.41 mg/dL — AB (ref 0.57–1.00)
Calcium: 9.9 mg/dL (ref 8.7–10.3)
Chloride: 99 mmol/L (ref 96–106)
GFR calc Af Amer: 43 mL/min/{1.73_m2} — ABNORMAL LOW (ref >59)
GFR calc non Af Amer: 37 mL/min/{1.73_m2} — ABNORMAL LOW (ref >59)
GLUCOSE: 148 mg/dL — AB (ref 65–99)
Potassium: 4.3 mmol/L (ref 3.5–5.2)
SODIUM: 138 mmol/L (ref 134–144)

## 2018-10-01 NOTE — Patient Instructions (Addendum)
Medication Instructions:  No changes.  Continue your current medications.   If you need a refill on your cardiac medications before your next appointment, please call your pharmacy.   Lab work: Today - BMET  If you have labs (blood work) drawn today and your tests are completely normal, you will receive your results only by: Marland Kitchen MyChart Message (if you have MyChart) OR . A paper copy in the mail If you have any lab test that is abnormal or we need to change your treatment, we will call you to review the results.  Testing/Procedures: None   Follow-Up: At [facility], you and your health needs are our priority.  As part of our continuing mission to provide you with exceptional heart care, we have created designated Provider Care Teams.  These Care Teams include your primary Cardiologist (physician) and Advanced Practice Providers (APPs -  Physician Assistants and Nurse Practitioners) who all work together to provide you with the care you need, when you need it. You will need a follow up appointment in:  3 months.  Please call our office 2 months in advance to schedule this appointment.  You may see Mertie Moores, MD or one of the following Advanced Practice Providers on your designated Care Team: Richardson Dopp, PA-C [place], [place] . Daune Perch, NP  Any Other Special Instructions Will Be Listed Below (If Applicable).  You can use Flonase for nasal and sinus congestion.  This can be purchased over the counter and the generic is fluticasone. You can use saline nose spray as well later in the day if you need to for congestion. You can use plain Mucinex as well.  Avoid getting anything that has a decongestant in it ("-D" in the name) as this will raise your blood pressure. You can also get Coricidin to help with cold symptoms. Call your primary care doctor to make sure you have follow up in the next 1-2 weeks.  If you develop a fever or cough with yellow or green phlegm, see your primary  care doctor sooner.

## 2018-10-01 NOTE — Progress Notes (Signed)
Cardiology Office Note:    Date:  10/01/2018   ID:  Jody Oneill, DOB 19-May-1946, MRN [id number]  PCP:  Jody Austin, MD  Cardiologist:  Jody Moores, MD   Electrophysiologist:  None  Nephrologist: Jody Oneill  Referring MD: Jody Austin, MD   Chief Complaint  Patient presents with  . Hospitalization Follow-up    syncope, chest pain     History of Present Illness:    Jody Oneill is a 72 y.o. female with diastolic heart failure, sleep apnea, diabetes, hypertension, chronic kidney disease.  Nuclear stress test in August 2018 was low risk.  Echocardiogram in August 2018 demonstrated moderate diastolic dysfunction and normal systolic function.  She was last seen by Jody Oneill in April 2019.    She was admitted 10/8-10/10 with syncope and chest discomfort.  She had been not feeling well with an upper respiratory tract infection for 3 to 4 days prior.  Appetite was poor.  Creatinine was elevated signifying acute kidney injury.  ECG demonstrated sinus bradycardia with heart rate in the 30s.  D-dimer was elevated and VQ scan was low probability for pulmonary embolism.  Syncopal episode was felt to be related to dehydration from recent URI in the setting of beta-blocker therapy contributing to bradycardia.  Her beta-blocker dose was reduced with improved heart rate.  There were no significant pauses on telemetry.  Creatinine improved with holding nephrotoxic agents and hydration.  She was seen by Jody Oneill for cardiology.  Cardiac enzymes were minimally elevated without clear trend.  No further ischemic work-up was recommended.  Jody Oneill returns for follow-up.  She is here with her daughter and grandson.  Since discharge, she has gradually felt better.  She denies shortness of breath, paroxysmal nocturnal dyspnea or lower extremity swelling.  She has not had further chest discomfort or syncope.  She was lightheaded on a couple of occasions.  She denies fever.  She has remained congested and  notes symptoms of xerostomia.    Prior CV studies:   The following studies were reviewed today:  Nuclear stress test 08/08/17 EF 65, no ischemia or scar, normal study  Echocardiogram 08/07/17 Mild concentric LVH, EF 55-60, normal wall motion, grade 2 diastolic dysfunction, moderate LAE  Echocardiogram 12/05/14 Mild focal basal septal hypertrophy, EF 55-60, normal wall motion, grade 2 diastolic dysfunction, mild LAE, PASP 36  Past Medical History:  Diagnosis Date  . Chronic diastolic CHF (congestive heart failure) ([place]) 06/15/2015  . Chronic kidney disease   . Fatty liver   . GERD (gastroesophageal reflux disease)   . Gout   . History of echocardiogram    Echo 8/18: mild conc LVH, EF 55-60, no RWMA, Gr 2 DD, mod LAE  . History of nuclear stress test    Nuclear stress test 8/18: EF 65, no ischemia or scar, normal study  . Hypertension   . Hypothyroid   . Obesity    fatty liver by [place]  . OSA on CPAP   . Osteoarthritis   . Type 2 diabetes mellitus ([place])    Surgical Hx: The patient  has a past surgical history that includes Biopsy breast (Left).   Current Medications: No outpatient medications have been marked as taking for the 10/01/18 encounter (Office Visit) with Jody Dopp T, PA-C.     Allergies:   Amlodipine besylate and Hydrochlorothiazide   Social History   Tobacco Use  . Smoking status: Never Smoker  . Smokeless tobacco: Never Used  Substance Use Topics  . Alcohol  use: No    Alcohol/week: 0.0 standard drinks    Frequency: Never  . Drug use: No     Family Hx: The patient's family history includes Breast cancer in her maternal aunt and maternal aunt; Diabetes in her father; Lung cancer in her mother; Other in her father.  ROS:   Please see the history of present illness.    Review of Systems  HENT: Positive for hearing loss.   Respiratory: Positive for cough.   Hematologic/Lymphatic: Bruises/bleeds easily.  Musculoskeletal: Positive for back pain.    Gastrointestinal: Positive for nausea.   All other systems reviewed and are negative.   EKGs/Labs/Other Test Reviewed:    EKG:  EKG is  ordered today.  The ekg ordered today demonstrates sinus bradycardia, heart rate 54, left axis deviation, right bundle branch block, similar to old EKG  Recent Labs: 09/26/2018: Hemoglobin 11.0; Platelets 212; TSH 2.564 09/27/2018: BUN 39; Creatinine, Ser 1.39; Potassium 4.0; Sodium 138   Recent Lipid Panel No results found for: CHOL, TRIG, HDL, CHOLHDL, LDLCALC, LDLDIRECT  Physical Exam:    VS:  BP (!) 142/70   Pulse 60   Ht 5\' 4"  (1.626 m)   Wt 214 lb 12.8 oz (97.4 kg)   SpO2 98%   BMI 36.87 kg/m     Wt Readings from Last 3 Encounters:  10/01/18 214 lb 12.8 oz (97.4 kg)  09/27/18 219 lb 1.6 oz (99.4 kg)  04/02/18 224 lb 12.8 oz (102 kg)     Physical Exam  Constitutional: She is oriented to person, place, and time. She appears well-developed and well-nourished. No distress.  HENT:  Head: Normocephalic and atraumatic.  Eyes: No scleral icterus.  Neck: No JVD present. No thyromegaly present.  Cardiovascular: Normal rate and regular rhythm.  No murmur heard. Pulmonary/Chest: Effort normal. She has no rales.  Abdominal: Soft. She exhibits no distension.  Musculoskeletal: She exhibits no edema.  Lymphadenopathy:    She has no cervical adenopathy.  Neurological: She is alert and oriented to person, place, and time.  Skin: Skin is warm and dry.  Psychiatric: She has a normal mood and affect.    ASSESSMENT & PLAN:    Syncope, unspecified syncope type This was likely vasovagal and related to dehydration in the setting of upper respiratory tract infection, poor appetite and bradycardia in the setting of beta-blocker use.  She has not had a recurrence.  Echo in August 2018 demonstrated normal LV function.  No further work-up at this time.  Bradycardia Improved.  Heart rate remains in the 50s today.  If she continues to have symptoms of  lightheadedness, consider decreasing metoprolol succinate further versus stopping it altogether.  Chest pain, unspecified type She had symptoms of atypical chest pain upon presentation to the hospital.  She has not had a recurrence.  Her stress test in August 2018 was low risk.  No further work-up.  Chronic diastolic CHF (congestive heart failure) (HCC) Volume status appears stable.  I suspect some of her lightheadedness is related to congestion from her upper respiratory tract infection.  Obtain follow-up BMET today.  If her creatinine is elevated, consider decreasing Lasix.  CKD (chronic kidney disease) stage 3, GFR 30-59 ml/min (HCC) She had evidence of acute kidney injury upon presentation to the hospital related to dehydration.  Creatinine improved prior to discharge.  Obtain follow-up BMET today.  Essential hypertension Blood pressure somewhat above target.  Continue current medications.  Continue to monitor for now.  Viral upper respiratory tract infection  We discussed different options for managing symptoms with her viral illness.  I have asked her to arrange follow-up with primary care as well.   Dispo:  Return in about 3 months (around 01/01/2019) for Routine Follow Up, w/ Jody Oneill, or Jody Dopp, PA-C.   Medication Adjustments/Labs and Tests Ordered: Current medicines are reviewed at length with the patient today.  Concerns regarding medicines are outlined above.  Tests Ordered: Orders Placed This Encounter  Procedures  . Basic Metabolic Panel (BMET)  . EKG 12-Lead   Medication Changes: No orders of the defined types were placed in this encounter.   Signed, Jody Dopp, PA-C  10/01/2018 11:49 AM    [facility] [address] [place], [place], [place]  [address] Phone: [phone]; Fax: [phone]

## 2018-10-02 ENCOUNTER — Telehealth: Payer: Self-pay | Admitting: *Deleted

## 2018-10-02 NOTE — Telephone Encounter (Signed)
-----   Message from Liliane Shi, PA-C sent at 10/01/2018  5:48 PM EDT ----- Creatinine fairly stable.  The potassium is normal.  The glucose is elevated.  Recommendations:  - Continue current medications and follow up as planned.  Richardson Dopp, PA-C    10/01/2018 5:47 PM

## 2018-10-02 NOTE — Telephone Encounter (Signed)
Pt has been notified of lab results by phone with verbal understanding. Pt thanked me for the call. I will forward results to PCP Dr. Darcus Austin as well.

## 2018-10-02 NOTE — Telephone Encounter (Signed)
Left message to go over lab results.  

## 2018-11-11 IMAGING — NM NM PULMONARY VENT & PERF
16 series · 16 of 16 positions shown · non-contrast
Comparison: Chest radiograph September 25, 2018

CLINICAL DATA: Chest pain.  Syncope.

EXAM:
NUCLEAR MEDICINE VENTILATION - PERFUSION LUNG SCAN
VIEWS:
Anterior, posterior, left lateral, right lateral, RPO, LPO, RAO,
LAO-ventilation and perfusion
RADIOPHARMACEUTICALS:  30.0 mCi of Sc-55m DTPA aerosol inhalation
and 4.0 mCi McXXm-RCC IV

[Series 1: ant/post vent · 4.14mm/px · 1 of 1 slices shown (1 of 2)]
[im 1/1]
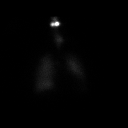

[Series 1: ant/post vent · 4.14mm/px · 1 of 1 slices shown (2 of 2)]
[im 1/1]
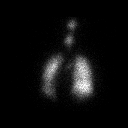

[Series 2: lao/rpo vent · 4.14mm/px · 1 of 1 slices shown (1 of 2)]
[im 1/1]
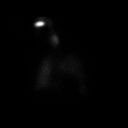

[Series 2: lao/rpo vent · 4.14mm/px · 1 of 1 slices shown (2 of 2)]
[im 1/1]
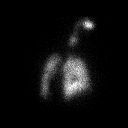

[Series 3: lpo/rao vent · 4.14mm/px · 1 of 1 slices shown (1 of 2)]
[im 1/1]
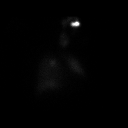

[Series 3: lpo/rao vent · 4.14mm/px · 1 of 1 slices shown (2 of 2)]
[im 1/1]
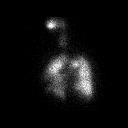

[Series 4: lt lat/rt lat vent · 4.14mm/px · 1 of 1 slices shown (1 of 2)]
[im 1/1]
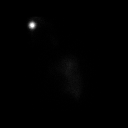

[Series 4: lt lat/rt lat vent · 4.14mm/px · 1 of 1 slices shown (2 of 2)]
[im 1/1]
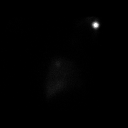

[Series 5: lt lat/rt lat perf · 4.14mm/px · 1 of 1 slices shown (1 of 2)]
[im 1/1]
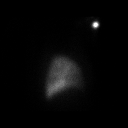

[Series 5: lt lat/rt lat perf · 4.14mm/px · 1 of 1 slices shown (2 of 2)]
[im 1/1]
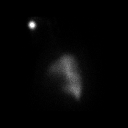

[Series 6: lpo/rao perf · 4.14mm/px · 1 of 1 slices shown (1 of 2)]
[im 1/1]
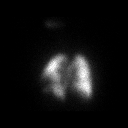

[Series 6: lpo/rao perf · 4.14mm/px · 1 of 1 slices shown (2 of 2)]
[im 1/1]
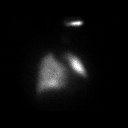

[Series 7: ant/post perf · 4.14mm/px · 1 of 1 slices shown (1 of 2)]
[im 1/1]
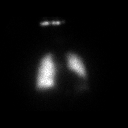

[Series 7: ant/post perf · 4.14mm/px · 1 of 1 slices shown (2 of 2)]
[im 1/1]
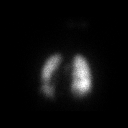

[Series 8: lao/rpo perf · 4.14mm/px · 1 of 1 slices shown (1 of 2)]
[im 1/1]
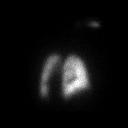

[Series 8: lao/rpo perf · 4.14mm/px · 1 of 1 slices shown (2 of 2)]
[im 1/1]
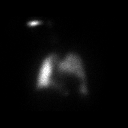

[16 of 16 positions shown; findings below may reference images not displayed]

FINDINGS: Ventilation: Radiotracer uptake on the ventilation study is
homogeneous and symmetric bilaterally. No ventilation defects are
evident.

Perfusion: Radiotracer uptake on the perfusion study is homogeneous
and symmetric bilaterally. No evident perfusion defects.

Heart is noted to be mildly enlarged.
IMPRESSION: No appreciable ventilation or perfusion defects. This study
constitutes a very low probability of pulmonary embolus. Heart noted
to be mildly enlarged.

## 2018-12-03 ENCOUNTER — Other Ambulatory Visit: Payer: Self-pay | Admitting: *Deleted

## 2018-12-03 MED ORDER — ISOSORBIDE MONONITRATE ER 30 MG PO TB24: 30.0000 mg | ORAL_TABLET | Freq: Every day | ORAL | 10 refills | Status: DC

## 2018-12-04 ENCOUNTER — Other Ambulatory Visit: Payer: Self-pay | Admitting: Cardiovascular Disease

## 2018-12-04 NOTE — Telephone Encounter (Signed)
Yes, please refill She will need an apt with an APP soon with BMP

## 2018-12-04 NOTE — Telephone Encounter (Signed)
[facility] is requesting a refill on furosemide 40 mg tablet taken daily. This medication was prescribed in the hospital. Dr. Acie Fredrickson did not prescribe this medication. Would Dr. Acie Fredrickson like to refill this medication? Please address

## 2018-12-05 MED ORDER — FUROSEMIDE 40 MG PO TABS: 40.0000 mg | ORAL_TABLET | Freq: Every day | ORAL | 9 refills | Status: DC

## 2018-12-05 NOTE — Telephone Encounter (Signed)
Pt's medication was sent to pt's pharmacy as requested. Confirmation received.  °

## 2018-12-18 ENCOUNTER — Other Ambulatory Visit: Payer: Self-pay | Admitting: Obstetrics and Gynecology

## 2018-12-18 DIAGNOSIS — R921 Mammographic calcification found on diagnostic imaging of breast: Secondary | ICD-10-CM

## 2019-01-01 ENCOUNTER — Encounter: Payer: Self-pay | Admitting: Cardiovascular Disease

## 2019-01-01 ENCOUNTER — Ambulatory Visit (INDEPENDENT_AMBULATORY_CARE_PROVIDER_SITE_OTHER): Payer: Medicare Other | Admitting: Cardiovascular Disease

## 2019-01-01 VITALS — BP 138/58 | HR 73 | Ht 64.0 in | Wt 215.6 lb

## 2019-01-01 DIAGNOSIS — I10 Essential (primary) hypertension: Secondary | ICD-10-CM | POA: Diagnosis not present

## 2019-01-01 DIAGNOSIS — I5032 Chronic diastolic (congestive) heart failure: Secondary | ICD-10-CM

## 2019-01-01 LAB — BASIC METABOLIC PANEL
BUN/Creatinine Ratio: 27 (ref 12–28)
BUN: 33 mg/dL — ABNORMAL HIGH (ref 8–27)
CALCIUM: 10 mg/dL (ref 8.7–10.3)
CHLORIDE: 100 mmol/L (ref 96–106)
CO2: 24 mmol/L (ref 20–29)
Creatinine, Ser: 1.23 mg/dL — ABNORMAL HIGH (ref 0.57–1.00)
GFR, EST AFRICAN AMERICAN: 51 mL/min/{1.73_m2} — AB (ref >59)
GFR, EST NON AFRICAN AMERICAN: 44 mL/min/{1.73_m2} — AB (ref >59)
Glucose: 163 mg/dL — ABNORMAL HIGH (ref 65–99)
Potassium: 3.8 mmol/L (ref 3.5–5.2)
Sodium: 139 mmol/L (ref 134–144)

## 2019-01-01 MED ORDER — FUROSEMIDE 40 MG PO TABS: 40.0000 mg | ORAL_TABLET | Freq: Every day | ORAL | 3 refills | Status: DC

## 2019-01-01 MED ORDER — POTASSIUM CHLORIDE ER 10 MEQ PO TBCR: 10.0000 meq | EXTENDED_RELEASE_TABLET | Freq: Every day | ORAL | 3 refills | Status: DC

## 2019-01-01 NOTE — Progress Notes (Signed)
Cardiology Office Note:    Date:  01/01/2019   ID:  Jody Oneill, DOB 06-Jan-1946, MRN [id number]  PCP:  Darcus Austin, MD (Inactive)  Cardiologist:  Nahser   Referring MD: Darcus Austin, MD   Chief Complaint  Patient presents with  . Congestive Heart Failure  . Hypertension      April 02, 2018     Jody Oneill is a 73 y.o. female with a hx of chronic diastolic congestive heart failure, obstructive sleep apnea, type 2 diabetes mellitus, hypertension, chronic kidney disease.  She was last seen in our office in September, 2018 by Richardson Dopp, PA.  She has had significant shortness of breath.  Echocardiogram performed in 2018 reveals normal left ventricular systolic function with moderate diastolic dysfunction.  Nuclear stress test was low risk without ischemia.  BP is high this am.  Has been eating out more recently  Keeps a record of her BP .  Typical BP is 145-150  [facility] to the [facility] - does silver sneakers and does water aerobics  No CP or dyspnea  - has some DOE when climbing steps .    January 01, 2019: Seen today for follow-up of her congestive heart failure, hypertension, obesity.  She also has diabetes mellitus.  Wt today is 215 lbs - down from 224 in April , 2019 She is getting some exercise.  She overall seems to be feeling quite a bit better.   Past Medical History:  Diagnosis Date  . Chronic diastolic CHF (congestive heart failure) ([place]) 06/15/2015  . Chronic kidney disease   . Fatty liver   . GERD (gastroesophageal reflux disease)   . Gout   . History of echocardiogram    Echo 8/18: mild conc LVH, EF 55-60, no RWMA, Gr 2 DD, mod LAE  . History of nuclear stress test    Nuclear stress test 8/18: EF 65, no ischemia or scar, normal study  . Hypertension   . Hypothyroid   . Obesity    fatty liver by [place]  . OSA on CPAP   . Osteoarthritis   . Type 2 diabetes mellitus ([place])     Past Surgical History:  Procedure Laterality Date  . BIOPSY BREAST Left    many  years ago    Current Medications: Current Meds  Medication Sig  . acetaminophen (TYLENOL) 325 MG tablet Take 650 mg by mouth every 6 (six) hours as needed for mild pain, moderate pain or headache.  . allopurinol (ZYLOPRIM) 300 MG tablet Take 300 mg by mouth every other day.   Marland Kitchen dextromethorphan (DELSYM) 30 MG/5ML liquid Take 30 mg by mouth as needed for cough.  Marland Kitchen GLIPIZIDE XL 10 MG 24 hr tablet Take 10 mg by mouth daily.   Marland Kitchen guaiFENesin-codeine 100-10 MG/5ML syrup Take 5 mLs by mouth 3 (three) times daily as needed for cough.  . hydrALAZINE (APRESOLINE) 50 MG tablet Take 50 mg by mouth 3 (three) times daily.  . isosorbide mononitrate (IMDUR) 30 MG 24 hr tablet Take 1 tablet (30 mg total) by mouth daily.  . lansoprazole (PREVACID) 30 MG capsule Take 30 mg by mouth every other day.   . levothyroxine (SYNTHROID, LEVOTHROID) 88 MCG tablet Take 88 mcg by mouth daily before breakfast.  . lisinopril (PRINIVIL,ZESTRIL) 40 MG tablet Take 1 tablet (40 mg total) by mouth daily.  . metoprolol succinate (TOPROL XL) 50 MG 24 hr tablet Take 1 tablet (50 mg total) by mouth daily. Take with or immediately following a meal.  .  Multiple Vitamin (MULTIVITAMIN) tablet Take 2 tablets by mouth daily.   Marland Kitchen neomycin-polymyxin-hydrocortisone (CORTISPORIN) 3.5-10000-1 OTIC suspension Place 4 drops into both ears as needed.  . pravastatin (PRAVACHOL) 20 MG tablet Take 20 mg by mouth daily.  . [DISCONTINUED] furosemide (LASIX) 40 MG tablet Take 40 mg by mouth 2 (two) times daily.  . [DISCONTINUED] potassium chloride (K-DUR) 10 MEQ tablet Take 1 tablet (10 mEq total) by mouth 2 (two) times daily.     Allergies:   Amlodipine besylate and Hydrochlorothiazide   Social History   Socioeconomic History  . Marital status: Widowed    Spouse name: Not on file  . Number of children: Not on file  . Years of education: Not on file  . Highest education level: Not on file  Occupational History  . Not on file  Social Needs    . Financial resource strain: Not on file  . Food insecurity:    Worry: Not on file    Inability: Not on file  . Transportation needs:    Medical: Not on file    Non-medical: Not on file  Tobacco Use  . Smoking status: Never Smoker  . Smokeless tobacco: Never Used  Substance and Sexual Activity  . Alcohol use: No    Alcohol/week: 0.0 standard drinks    Frequency: Never  . Drug use: No  . Sexual activity: Not on file  Lifestyle  . Physical activity:    Days per week: Not on file    Minutes per session: Not on file  . Stress: Not on file  Relationships  . Social connections:    Talks on phone: Not on file    Gets together: Not on file    Attends religious service: Not on file    Active member of club or organization: Not on file    Attends meetings of clubs or organizations: Not on file    Relationship status: Not on file  Other Topics Concern  . Not on file  Social History Narrative   Tobacco use  Cigarettes: Never smoked ,Tobacco history updated 10/23/2014   Alcohol  : yes occasionally . Caffeine  Yes, rare no recreational drugs .   Exercise Yes   -three times a week water aerobics   Occupation unemployed /retired   Martial status single widowed (2006)    2 daughters    Seat belt use - yes        Family History: The patient's family history includes Breast cancer in her maternal aunt and maternal aunt; Diabetes in her father; Lung cancer in her mother; Other in her father.  ROS:   Please see the history of present illness.     All other systems reviewed and are negative.  EKGs/Labs/Other Studies Reviewed:    The following studies were reviewed today:   EKG:    Recent Labs: 09/26/2018: Hemoglobin 11.0; Platelets 212; TSH 2.564 01/01/2019: BUN 33; Creatinine, Ser 1.23; Potassium 3.8; Sodium 139  Recent Lipid Panel No results found for: CHOL, TRIG, HDL, CHOLHDL, VLDL, LDLCALC, LDLDIRECT  Physical Exam: Blood pressure (!) 138/58, pulse 73, height 5\' 4"  (1.626  m), weight 215 lb 9.6 oz (97.8 kg), SpO2 98 %.  GEN:  Moderately obese female,   HEENT: Normal NECK: No JVD; No carotid bruits LYMPHATICS: No lymphadenopathy CARDIAC: RRR  RESPIRATORY:  Clear to auscultation without rales, wheezing or rhonchi  ABDOMEN: Soft, non-tender, non-distended MUSCULOSKELETAL:  No edema; No deformity  SKIN: Warm and dry NEUROLOGIC:  Alert and oriented  x 3     ASSESSMENT:    1. Chronic diastolic heart failure ([place])   2. Essential hypertension    PLAN:    In order of problems listed above:  1. Chronic diastolic congestive heart failure Seems to be doing well. Continue current medications.   2.  Essential hypertension -  BP is well controlled.      Medication Adjustments/Labs and Tests Ordered: Current medicines are reviewed at length with the patient today.  Concerns regarding medicines are outlined above.  Orders Placed This Encounter  Procedures  . Basic Metabolic Panel (BMET)   Meds ordered this encounter  Medications  . furosemide (LASIX) 40 MG tablet    Sig: Take 1 tablet (40 mg total) by mouth daily.    Dispense:  90 tablet    Refill:  3  . potassium chloride (K-DUR) 10 MEQ tablet    Sig: Take 1 tablet (10 mEq total) by mouth daily.    Dispense:  90 tablet    Refill:  3      Signed, Mertie Moores, MD  01/01/2019 6:10 PM    [facility]

## 2019-01-01 NOTE — Patient Instructions (Signed)
Medication Instructions:  Your physician has recommended you make the following change in your medication:  DECREASE Lasix (Furosemide) to 40 mg once daily DECREASE Kdur (Potassium) to 10 mEq once daily  If you need a refill on your cardiac medications before your next appointment, please call your pharmacy.   Lab work: TODAY - basic metabolic panel    Testing/Procedures: None Ordered   Follow-Up: At [facility], you and your health needs are our priority.  As part of our continuing mission to provide you with exceptional heart care, we have created designated Provider Care Teams.  These Care Teams include your primary Cardiologist (physician) and Advanced Practice Providers (APPs -  Physician Assistants and Nurse Practitioners) who all work together to provide you with the care you need, when you need it. You will need a follow up appointment in:  3 months with Richardson Dopp, PA-C

## 2019-01-21 DIAGNOSIS — E039 Hypothyroidism, unspecified: Secondary | ICD-10-CM | POA: Diagnosis not present

## 2019-01-21 DIAGNOSIS — Z1211 Encounter for screening for malignant neoplasm of colon: Secondary | ICD-10-CM | POA: Diagnosis not present

## 2019-01-21 DIAGNOSIS — E1122 Type 2 diabetes mellitus with diabetic chronic kidney disease: Secondary | ICD-10-CM | POA: Diagnosis not present

## 2019-01-21 DIAGNOSIS — D649 Anemia, unspecified: Secondary | ICD-10-CM | POA: Diagnosis not present

## 2019-01-21 DIAGNOSIS — I10 Essential (primary) hypertension: Secondary | ICD-10-CM | POA: Diagnosis not present

## 2019-01-23 DIAGNOSIS — N2581 Secondary hyperparathyroidism of renal origin: Secondary | ICD-10-CM | POA: Diagnosis not present

## 2019-01-23 DIAGNOSIS — E1122 Type 2 diabetes mellitus with diabetic chronic kidney disease: Secondary | ICD-10-CM | POA: Diagnosis not present

## 2019-01-23 DIAGNOSIS — N183 Chronic kidney disease, stage 3 (moderate): Secondary | ICD-10-CM | POA: Diagnosis not present

## 2019-01-23 DIAGNOSIS — I129 Hypertensive chronic kidney disease with stage 1 through stage 4 chronic kidney disease, or unspecified chronic kidney disease: Secondary | ICD-10-CM | POA: Diagnosis not present

## 2019-01-29 ENCOUNTER — Ambulatory Visit
Admission: RE | Admit: 2019-01-29 | Discharge: 2019-01-29 | Disposition: A | Discharge status: Home / Self Care | Payer: Medicare Other | Source: Ambulatory Visit | Attending: Obstetrics and Gynecology | Admitting: Obstetrics and Gynecology

## 2019-01-29 DIAGNOSIS — R921 Mammographic calcification found on diagnostic imaging of breast: Secondary | ICD-10-CM | POA: Diagnosis not present

## 2019-01-30 DIAGNOSIS — H35033 Hypertensive retinopathy, bilateral: Secondary | ICD-10-CM | POA: Diagnosis not present

## 2019-01-30 DIAGNOSIS — H353132 Nonexudative age-related macular degeneration, bilateral, intermediate dry stage: Secondary | ICD-10-CM | POA: Diagnosis not present

## 2019-02-06 DIAGNOSIS — G4733 Obstructive sleep apnea (adult) (pediatric): Secondary | ICD-10-CM | POA: Diagnosis not present

## 2019-03-27 ENCOUNTER — Telehealth: Payer: Self-pay | Admitting: Physician Assistant

## 2019-03-27 NOTE — Telephone Encounter (Signed)
° °  4/8 :  Walked patient through steps to active MyChart. Patient was able to login MyChart. Patient does not have a smartphone or device with camera. Visit type will be phone visit.

## 2019-03-29 ENCOUNTER — Telehealth: Payer: Self-pay | Admitting: *Deleted

## 2019-03-29 NOTE — Telephone Encounter (Signed)
SPOKE WITH PT AND PT CONSENTED CONSENT SENT TO MY CHART

## 2019-04-01 NOTE — Progress Notes (Signed)
Virtual Visit via Video Note   This visit type was conducted due to national recommendations for restrictions regarding the COVID-19 Pandemic (e.g. social distancing) in an effort to limit this patient's exposure and mitigate transmission in our community.  Due to her co-morbid illnesses, this patient is at least at moderate risk for complications without adequate follow up.  This format is felt to be most appropriate for this patient at this time.  All issues noted in this document were discussed and addressed.  A limited physical exam was performed with this format.  Please refer to the patient's chart for her consent to telehealth for [facility].   Evaluation Performed:  Follow-up visit  Date:  04/02/2019   ID:  Jody Oneill, DOB [birth year], MRN [id number]  Patient Location: Home Provider Location: Home  PCP:  Darcus Austin, MD (Inactive)  Cardiologist:  Mertie Moores, MD   Electrophysiologist:  None   Chief Complaint:  FU on CHF, HTN  History of Present Illness:    Jody Oneill is a 73 y.o. female who presents via audio/video conferencing for a telehealth visit today.    She has diastolic heart failure, sleep apnea, diabetes, hypertension, chronic kidney disease.  Nuclear stress test in August 2018 was low risk.  Echocardiogram in August 2018 demonstrated moderate diastolic dysfunction and normal systolic function.  She was last seen by Dr. Acie Fredrickson in 12/2018.  He reduced her Furosemide and recommended follow up in 3 mos.    Today, she is seen via D[employer] video application.  She has been doing well.  She has not had chest discomfort, shortness of breath, syncope, paroxysmal nocturnal dyspnea or significant lower extremity swelling.  Her blood pressure typically runs 093 systolic.  The patient does not have symptoms concerning for COVID-19 infection (fever, chills, cough, or new shortness of breath).    Past Medical History:  Diagnosis Date  . Chronic diastolic CHF  (congestive heart failure) ([place]) 06/15/2015  . Chronic kidney disease   . Fatty liver   . GERD (gastroesophageal reflux disease)   . Gout   . History of echocardiogram    Echo 8/18: mild conc LVH, EF 55-60, no RWMA, Gr 2 DD, mod LAE  . History of nuclear stress test    Nuclear stress test 8/18: EF 65, no ischemia or scar, normal study  . Hypertension   . Hypothyroid   . Obesity    fatty liver by [place]  . OSA on CPAP   . Osteoarthritis   . Type 2 diabetes mellitus ([place])    Past Surgical History:  Procedure Laterality Date  . BIOPSY BREAST Left    many years ago     Current Meds  Medication Sig  . acetaminophen (TYLENOL) 325 MG tablet Take 650 mg by mouth every 6 (six) hours as needed for mild pain, moderate pain or headache.  . allopurinol (ZYLOPRIM) 300 MG tablet Take 300 mg by mouth every other day.   . furosemide (LASIX) 40 MG tablet Take 1 tablet (40 mg total) by mouth daily.  Marland Kitchen GLIPIZIDE XL 10 MG 24 hr tablet Take 10 mg by mouth daily.   . hydrALAZINE (APRESOLINE) 50 MG tablet Take 50 mg by mouth 3 (three) times daily.  . lansoprazole (PREVACID) 30 MG capsule Take 30 mg by mouth every other day.   . levothyroxine (SYNTHROID, LEVOTHROID) 88 MCG tablet Take 88 mcg by mouth daily before breakfast.  . lisinopril (PRINIVIL,ZESTRIL) 40 MG tablet Take 1 tablet (40 mg total) by  mouth daily.  . metoprolol succinate (TOPROL XL) 50 MG 24 hr tablet Take 1 tablet (50 mg total) by mouth daily. Take with or immediately following a meal.  . Multiple Vitamin (MULTIVITAMIN) tablet Take 2 tablets by mouth daily.   . potassium chloride (K-DUR) 10 MEQ tablet Take 1 tablet (10 mEq total) by mouth daily.  . pravastatin (PRAVACHOL) 20 MG tablet Take 20 mg by mouth daily.  . [DISCONTINUED] isosorbide mononitrate (IMDUR) 30 MG 24 hr tablet Take 1 tablet (30 mg total) by mouth daily.     Allergies:   Amlodipine besylate and Hydrochlorothiazide   Social History   Tobacco Use  . Smoking status: Never  Smoker  . Smokeless tobacco: Never Used  Substance Use Topics  . Alcohol use: No    Alcohol/week: 0.0 standard drinks    Frequency: Never  . Drug use: No     Family Hx: The patient's family history includes Breast cancer in her maternal aunt and maternal aunt; Diabetes in her father; Lung cancer in her mother; Other in her father.  ROS:   Please see the history of present illness.    All other systems reviewed and are negative.   Prior CV studies:   The following studies were reviewed today:  Nuclear stress test 08/08/17 EF 65, no ischemia or scar, normal study   Echocardiogram 08/07/17 Mild concentric LVH, EF 55-60, normal wall motion, grade 2 diastolic dysfunction, moderate LAE   Echocardiogram 12/05/14 Mild focal basal septal hypertrophy, EF 55-60, normal wall motion, grade 2 diastolic dysfunction, mild LAE, PASP 36   Labs/Other Tests and Data Reviewed:    EKG:  No ECG reviewed.  Recent Labs: 09/26/2018: Hemoglobin 11.0; Platelets 212; TSH 2.564 01/01/2019: BUN 33; Creatinine, Ser 1.23; Potassium 3.8; Sodium 139   Recent Lipid Panel No results found for: CHOL, TRIG, HDL, CHOLHDL, LDLCALC, LDLDIRECT   From [facility] Tool    Wt Readings from Last 3 Encounters:  04/02/19 218 lb (98.9 kg)  01/01/19 215 lb 9.6 oz (97.8 kg)  10/01/18 214 lb 12.8 oz (97.4 kg)     Objective:    Vital Signs:  BP 140/74   Pulse 67   Ht 5\' 4"  (1.626 m)   Wt 218 lb (98.9 kg)   BMI 37.42 kg/m    GEN:  During our conversation, the patient does not seem to be in acute distress.   RESP:  Respiratory effort seems to be normal.   NEURO:  Alert and Oriented.   PSYCH:  The patient is in good spirits.    ASSESSMENT & PLAN:    Chronic diastolic CHF (congestive heart failure) (HCC) EF 55-60 with moderate diastolic dysfunction by echocardiogram in August 2018.  Volume status appears stable.  Continue current dose of furosemide.  Essential hypertension Blood pressure running above target.  We  discussed the importance of good blood pressure control for risk factor modification.  Continue current dose of furosemide, hydralazine, lisinopril, metoprolol.  Increase isosorbide to 60 mg daily.  COVID-19 Education: The signs and symptoms of COVID-19 were discussed with the patient and how to seek care for testing (follow up with PCP or arrange E-visit).  The importance of social distancing was discussed today.  Time:   Today, I have spent 10 minutes with the patient with telehealth technology discussing the above problems.     Medication Adjustments/Labs and Tests Ordered: Current medicines are reviewed at length with the patient today.  Concerns regarding medicines are outlined above.  Tests Ordered:  No orders of the defined types were placed in this encounter.   Medication Changes: Meds ordered this encounter  Medications  . isosorbide mononitrate (IMDUR) 60 MG 24 hr tablet    Sig: Take 1 tablet (60 mg total) by mouth daily.    Dispense:  90 tablet    Refill:  3    Dose Increase    Order Specific Question:   Supervising Provider    Answer:   Lelon Perla [[id number]]    Disposition:  Follow up in 6 month(s)  Signed, Richardson Dopp, PA-C  04/02/2019 2:17 PM    [place]

## 2019-04-02 ENCOUNTER — Other Ambulatory Visit: Payer: Self-pay

## 2019-04-02 ENCOUNTER — Encounter: Payer: Self-pay | Admitting: Physician Assistant

## 2019-04-02 ENCOUNTER — Telehealth (INDEPENDENT_AMBULATORY_CARE_PROVIDER_SITE_OTHER): Payer: Medicare Other | Admitting: Physician Assistant

## 2019-04-02 VITALS — BP 140/74 | HR 67 | Ht 64.0 in | Wt 218.0 lb

## 2019-04-02 DIAGNOSIS — I5032 Chronic diastolic (congestive) heart failure: Secondary | ICD-10-CM | POA: Diagnosis not present

## 2019-04-02 DIAGNOSIS — N183 Chronic kidney disease, stage 3 unspecified: Secondary | ICD-10-CM

## 2019-04-02 DIAGNOSIS — I10 Essential (primary) hypertension: Secondary | ICD-10-CM | POA: Diagnosis not present

## 2019-04-02 DIAGNOSIS — Z7189 Other specified counseling: Secondary | ICD-10-CM | POA: Diagnosis not present

## 2019-04-02 MED ORDER — ISOSORBIDE MONONITRATE ER 60 MG PO TB24: 60.0000 mg | ORAL_TABLET | Freq: Every day | ORAL | 3 refills | Status: DC

## 2019-04-02 NOTE — Patient Instructions (Signed)
Medication Instructions:  Increase Imdur (Isosorbide) to 60 mg once daily.  You can take 2 of the 30 mg tablets to equal 60 mg. Your new prescription will be for 60 mg tablets.  If you need a refill on your cardiac medications before your next appointment, please call your pharmacy.   Lab work: None   If you have labs (blood work) drawn today and your tests are completely normal, you will receive your results only by: Marland Kitchen MyChart Message (if you have MyChart) OR . A paper copy in the mail If you have any lab test that is abnormal or we need to change your treatment, we will call you to review the results.  Testing/Procedures: None   Follow-Up: At [facility], you and your health needs are our priority.  As part of our continuing mission to provide you with exceptional heart care, we have created designated Provider Care Teams.  These Care Teams include your primary Cardiologist (physician) and Advanced Practice Providers (APPs -  Physician Assistants and Nurse Practitioners) who all work together to provide you with the care you need, when you need it. You will need a follow up appointment in:  6 months.  Please call our office 2 months in advance to schedule this appointment.  You may see Mertie Moores, MD or Richardson Dopp, PA-C   Any Other Special Instructions Will Be Listed Below (If Applicable). Call if you notice that your blood pressure is consistently above 130/80. Try to limit your salt intake as much as possible.

## 2019-04-24 ENCOUNTER — Other Ambulatory Visit: Payer: Self-pay | Admitting: Cardiovascular Disease

## 2019-05-09 ENCOUNTER — Other Ambulatory Visit: Payer: Self-pay | Admitting: Cardiovascular Disease

## 2019-08-07 DIAGNOSIS — E7800 Pure hypercholesterolemia, unspecified: Secondary | ICD-10-CM | POA: Diagnosis not present

## 2019-08-07 DIAGNOSIS — K219 Gastro-esophageal reflux disease without esophagitis: Secondary | ICD-10-CM | POA: Diagnosis not present

## 2019-08-07 DIAGNOSIS — E1122 Type 2 diabetes mellitus with diabetic chronic kidney disease: Secondary | ICD-10-CM | POA: Diagnosis not present

## 2019-08-07 DIAGNOSIS — M1000 Idiopathic gout, unspecified site: Secondary | ICD-10-CM | POA: Diagnosis not present

## 2019-08-07 DIAGNOSIS — D649 Anemia, unspecified: Secondary | ICD-10-CM | POA: Diagnosis not present

## 2019-08-07 DIAGNOSIS — Z0000 Encounter for general adult medical examination without abnormal findings: Secondary | ICD-10-CM | POA: Diagnosis not present

## 2019-08-07 DIAGNOSIS — E039 Hypothyroidism, unspecified: Secondary | ICD-10-CM | POA: Diagnosis not present

## 2019-08-07 DIAGNOSIS — I5032 Chronic diastolic (congestive) heart failure: Secondary | ICD-10-CM | POA: Diagnosis not present

## 2019-08-07 DIAGNOSIS — I10 Essential (primary) hypertension: Secondary | ICD-10-CM | POA: Diagnosis not present

## 2019-08-12 ENCOUNTER — Other Ambulatory Visit: Payer: Self-pay | Admitting: Obstetrics and Gynecology

## 2019-08-12 DIAGNOSIS — E2839 Other primary ovarian failure: Secondary | ICD-10-CM

## 2019-08-27 DIAGNOSIS — Z23 Encounter for immunization: Secondary | ICD-10-CM | POA: Diagnosis not present

## 2019-09-10 DIAGNOSIS — E1122 Type 2 diabetes mellitus with diabetic chronic kidney disease: Secondary | ICD-10-CM | POA: Diagnosis not present

## 2019-09-10 DIAGNOSIS — Z86010 Personal history of colonic polyps: Secondary | ICD-10-CM | POA: Diagnosis not present

## 2019-09-10 DIAGNOSIS — K219 Gastro-esophageal reflux disease without esophagitis: Secondary | ICD-10-CM | POA: Diagnosis not present

## 2019-09-10 DIAGNOSIS — I5032 Chronic diastolic (congestive) heart failure: Secondary | ICD-10-CM | POA: Diagnosis not present

## 2019-09-10 DIAGNOSIS — G4733 Obstructive sleep apnea (adult) (pediatric): Secondary | ICD-10-CM | POA: Diagnosis not present

## 2019-09-10 DIAGNOSIS — N183 Chronic kidney disease, stage 3 (moderate): Secondary | ICD-10-CM | POA: Diagnosis not present

## 2019-09-19 ENCOUNTER — Telehealth: Payer: Self-pay

## 2019-09-19 NOTE — Telephone Encounter (Signed)
   [facility] Pre-operative Risk Assessment    Request for surgical clearance:  1. What type of surgery is being performed? Colonoscopy   2. When is this surgery scheduled? TBD   3. What type of clearance is required (medical clearance vs. Pharmacy clearance to hold med vs. Both)? Medical  4. Are there any medications that need to be held prior to surgery and how long? No   5. Practice name and name of physician performing surgery? [facility], Dr. Laurence Spates   6. What is your office phone number [phone]    7.   What is your office fax number [phone]  8.   Anesthesia type (None, local, MAC, general) ? Propofol   Mady Haagensen 09/19/2019, 10:42 AM  _________________________________________________________________   (provider comments below)

## 2019-09-19 NOTE — Telephone Encounter (Signed)
   Primary Cardiologist: Mertie Moores, MD  Chart reviewed as part of pre-operative protocol coverage. Patient was contacted 09/19/2019 in reference to pre-operative risk assessment for pending surgery as outlined below.  Jody Oneill was last seen on 03/2019 by Richardson Dopp PAC.  Since that day, Jody Oneill has done well.  She does not have a history of MI or stroke. She denies anginal symptoms. Stress test in 2018 without signs of ischemia.   Therefore, based on ACC/AHA guidelines, the patient would be at acceptable risk for the planned procedure without further cardiovascular testing.   I will route this recommendation to the requesting party via Epic fax function and remove from pre-op pool.  Please call with questions.  [place], PA 09/19/2019, 11:26 AM

## 2019-09-23 ENCOUNTER — Other Ambulatory Visit: Payer: Self-pay

## 2019-09-23 DIAGNOSIS — Z20822 Contact with and (suspected) exposure to covid-19: Secondary | ICD-10-CM

## 2019-09-25 LAB — NOVEL CORONAVIRUS, NAA: SARS-CoV-2, NAA: NOT DETECTED

## 2019-09-27 ENCOUNTER — Other Ambulatory Visit: Payer: Self-pay | Admitting: Gastroenterology

## 2019-10-08 NOTE — Progress Notes (Signed)
Cardiology Office Note:    Date:  10/09/2019   ID:  Jody Oneill, DOB Nov 01, 1946, MRN [id number]  PCP:  Jody Smoker, MD  Cardiologist:  Jody Moores, MD  Electrophysiologist:  None   Referring MD: Jody Oneill, *   Chief Complaint  Patient presents with  . Follow-up    Diastolic CHF, HTN    History of Present Illness:    Jody Oneill is a 73 y.o. female with:   Diastolic CHF  OSA  Diabetes mellitus   Hypertension   Chronic kidney disease   Myoview in 07/2017 low risk  Echocardiogram [id number]: mod diastolic dysfunction, normal LVSF  Fatty Liver  Hypothyroidism    Jody Oneill was last seen via Telemedicine in 03/2019.  She returns for follow-up.  She is here alone.  She has not noticed significant shortness of breath with exertion.  However, her daughter has noted that she breathes heavier.  She has not had orthopnea, chest discomfort or significant lower extremity swelling.  She has not had syncope.  She has noted some headaches recently.   Prior CV studies:   The following studies were reviewed today:  Nuclear stress test 08/08/17 EF 65, no ischemia or scar, normal study  Echocardiogram 08/07/17 Mild concentric LVH, EF 55-60, normal wall motion, grade 2 diastolic dysfunction, moderate LAE  Echocardiogram 12/05/14 Mild focal basal septal hypertrophy, EF 55-60, normal wall motion, grade 2 diastolic dysfunction, mild LAE, PASP 36  Past Medical History:  Diagnosis Date  . Chronic diastolic CHF (congestive heart failure) ([place]) 06/15/2015  . Chronic kidney disease   . Fatty liver   . GERD (gastroesophageal reflux disease)   . Gout   . History of echocardiogram    Echo 8/18: mild conc LVH, EF 55-60, no RWMA, Gr 2 DD, mod LAE  . History of nuclear stress test    Nuclear stress test 8/18: EF 65, no ischemia or scar, normal study  . Hypertension   . Hypothyroid   . Obesity    fatty liver by [place]  . OSA on CPAP   . Osteoarthritis   . Type 2  diabetes mellitus ([place])    Surgical Hx: The patient  has a past surgical history that includes Biopsy breast (Left).   Current Medications: Current Meds  Medication Sig  . acetaminophen (TYLENOL) 325 MG tablet Take 650 mg by mouth every 6 (six) hours as needed for mild pain, moderate pain or headache.  . allopurinol (ZYLOPRIM) 300 MG tablet Take 300 mg by mouth every other day.   . furosemide (LASIX) 40 MG tablet Take 1 tablet (40 mg total) by mouth daily.  Marland Kitchen GLIPIZIDE XL 10 MG 24 hr tablet Take 10 mg by mouth daily.   . hydrALAZINE (APRESOLINE) 50 MG tablet Take 1.5 tablets (75 mg total) by mouth 3 (three) times daily.  . isosorbide mononitrate (IMDUR) 60 MG 24 hr tablet Take 1.5 tablets (90 mg total) by mouth daily.  . lansoprazole (PREVACID) 30 MG capsule Take 30 mg by mouth daily as needed.   Marland Kitchen levothyroxine (SYNTHROID, LEVOTHROID) 88 MCG tablet Take 88 mcg by mouth daily before breakfast.  . lisinopril (PRINIVIL,ZESTRIL) 40 MG tablet Take 1 tablet (40 mg total) by mouth daily.  . metoprolol succinate (TOPROL XL) 50 MG 24 hr tablet Take 1 tablet (50 mg total) by mouth daily. Take with or immediately following a meal.  . Multiple Vitamin (MULTIVITAMIN) tablet Take 2 tablets by mouth daily.   . potassium chloride (K-DUR) 10  MEQ tablet TAKE 1 TABLET (10 MEQ TOTAL) BY MOUTH 2 (TWO) TIMES DAILY.  . pravastatin (PRAVACHOL) 20 MG tablet Take 20 mg by mouth daily.  . [DISCONTINUED] hydrALAZINE (APRESOLINE) 50 MG tablet TAKE 1 TABLET BY MOUTH THREE TIMES A DAY  . [DISCONTINUED] isosorbide mononitrate (IMDUR) 60 MG 24 hr tablet Take 1 tablet (60 mg total) by mouth daily.     Allergies:   Amlodipine besylate and Hydrochlorothiazide   Social History   Tobacco Use  . Smoking status: Never Oneill  . Smokeless tobacco: Never Used  Substance Use Topics  . Alcohol use: No    Alcohol/week: 0.0 standard drinks    Frequency: Never  . Drug use: No     Family Hx: The patient's family history  includes Breast cancer in her maternal aunt and maternal aunt; Diabetes in her father; Lung cancer in her mother; Other in her father.  ROS:   Please see the history of present illness.    ROS All other systems reviewed and are negative.   EKGs/Labs/Other Test Reviewed:    EKG:  EKG is  ordered today.  The ekg ordered today demonstrates normal sinus rhythm, heart rate 79, left axis deviation, right bundle branch block, QTC 520, no change from prior tracing  Recent Labs: 01/01/2019: BUN 33; Creatinine, Ser 1.23; Potassium 3.8; Sodium 139   Recent Lipid Panel No results found for: CHOL, TRIG, HDL, CHOLHDL, LDLCALC, LDLDIRECT      Physical Exam:    VS:  BP (!) 162/84   Pulse 79   Ht 5\' 4"  (1.626 m)   Wt 226 lb (102.5 kg)   SpO2 98%   BMI 38.79 kg/m     Wt Readings from Last 3 Encounters:  10/09/19 226 lb (102.5 kg)  04/02/19 218 lb (98.9 kg)  01/01/19 215 lb 9.6 oz (97.8 kg)     Physical Exam  Constitutional: She is oriented to person, place, and time. She appears well-developed and well-nourished. No distress.  HENT:  Head: Normocephalic and atraumatic.  Eyes: No scleral icterus.  Neck: No JVD present. No thyromegaly present.  Cardiovascular: Normal rate, regular rhythm and normal heart sounds.  No murmur heard. Pulmonary/Chest: Effort normal and breath sounds normal. She has no rales.  Abdominal: Soft. There is no hepatomegaly.  Musculoskeletal:        General: Edema (trace edema bilat) present.  Lymphadenopathy:    She has no cervical adenopathy.  Neurological: She is alert and oriented to person, place, and time.  Skin: Skin is warm and dry.  Psychiatric: She has a normal mood and affect.    ASSESSMENT & PLAN:    1. Chronic diastolic CHF (congestive heart failure) (HCC) EF 55-60 with moderate diastolic dysfunction by echocardiogram August 2018.  On exam, her volume status seems to be stable.  She has not really noticed significant shortness of breath with  exertion.  Her daughter has noted that she breathes heavier.  We discussed the importance of salt as well as slowly increasing activity and weight loss.  Continue current dose of furosemide.  2. Essential hypertension Blood pressure remains above target.  Again, we discussed low-sodium diet, slowly increasing activity and weight loss.  I will increase hydralazine to 75 mg 3 times a day and isosorbide to 90 mg daily.  If her blood pressure mains above target, consider increasing metoprolol succinate to 100 mg daily.  3. Stage 3a chronic kidney disease Recent creatinine normal at 1.12.  4. Type 2 diabetes mellitus  without complication, without long-term current use of insulin (HCC) Recent hemoglobin A1c 6.5.    Dispo:  Return in about 3 months (around 01/09/2020) for Routine Follow Up, w/ Dr. Acie Fredrickson, or Richardson Dopp, PA-C.   Medication Adjustments/Labs and Tests Ordered: Current medicines are reviewed at length with the patient today.  Concerns regarding medicines are outlined above.  Tests Ordered: Orders Placed This Encounter  Procedures  . EKG 12-Lead   Medication Changes: Meds ordered this encounter  Medications  . hydrALAZINE (APRESOLINE) 50 MG tablet    Sig: Take 1.5 tablets (75 mg total) by mouth 3 (three) times daily.    Dispense:  270 tablet    Refill:  2  . isosorbide mononitrate (IMDUR) 60 MG 24 hr tablet    Sig: Take 1.5 tablets (90 mg total) by mouth daily.    Dispense:  135 tablet    Refill:  3    Dose Increase    Signed, Richardson Dopp, PA-C  10/09/2019 5:38 PM    [facility] [address], [place], [place]  [address] Phone: [phone]; Fax: [phone]

## 2019-10-09 ENCOUNTER — Encounter: Payer: Self-pay | Admitting: Physician Assistant

## 2019-10-09 ENCOUNTER — Ambulatory Visit (INDEPENDENT_AMBULATORY_CARE_PROVIDER_SITE_OTHER): Payer: Medicare Other | Admitting: Physician Assistant

## 2019-10-09 ENCOUNTER — Other Ambulatory Visit: Payer: Self-pay

## 2019-10-09 VITALS — BP 162/84 | HR 79 | Ht 64.0 in | Wt 226.0 lb

## 2019-10-09 DIAGNOSIS — E119 Type 2 diabetes mellitus without complications: Secondary | ICD-10-CM | POA: Diagnosis not present

## 2019-10-09 DIAGNOSIS — I5032 Chronic diastolic (congestive) heart failure: Secondary | ICD-10-CM

## 2019-10-09 DIAGNOSIS — I10 Essential (primary) hypertension: Secondary | ICD-10-CM | POA: Diagnosis not present

## 2019-10-09 DIAGNOSIS — N1831 Chronic kidney disease, stage 3a: Secondary | ICD-10-CM

## 2019-10-09 MED ORDER — ISOSORBIDE MONONITRATE ER 60 MG PO TB24: 90.0000 mg | ORAL_TABLET | Freq: Every day | ORAL | 3 refills | Status: DC

## 2019-10-09 MED ORDER — HYDRALAZINE HCL 50 MG PO TABS: 75.0000 mg | ORAL_TABLET | Freq: Three times a day (TID) | ORAL | 2 refills | Status: DC

## 2019-10-09 NOTE — Patient Instructions (Signed)
Medication Instructions:  Your physician has recommended you make the following change in your medication:  1. Increase Hydralazine to one and one half tablet (75mg ) three times a day. 2. Increase  Imdur to one and one half tablet (90 mg ) everyday.  Sent in today to requested pharmacy.   *If you need a refill on your cardiac medications before your next appointment, please call your pharmacy*  Lab Work: -None  If you have labs (blood work) drawn today and your tests are completely normal, you will receive your results only by: Marland Kitchen MyChart Message (if you have MyChart) OR . A paper copy in the mail If you have any lab test that is abnormal or we need to change your treatment, we will call you to review the results.  Testing/Procedures: -None  Follow-Up: At [facility], you and your health needs are our priority.  As part of our continuing mission to provide you with exceptional heart care, we have created designated Provider Care Teams.  These Care Teams include your primary Cardiologist (physician) and Advanced Practice Providers (APPs -  Physician Assistants and Nurse Practitioners) who all work together to provide you with the care you need, when you need it.  Your next appointment:   3 months with Richardson Dopp, PA-C.  The format for your next appointment:   Either In Person or Virtual/ Patient prefers in person.   Other Instructions  Increase your level of activity and you are encouraged to lose 10 lbs.      Two Gram Sodium Diet 2000 mg  What is Sodium? Sodium is a mineral found naturally in many foods. The most significant source of sodium in the diet is table salt, which is about 40% sodium.  Processed, convenience, and preserved foods also contain a large amount of sodium.  The body needs only 500 mg of sodium daily to function,  A normal diet provides more than enough sodium even if you do not use salt.  Why Limit Sodium? A build up of sodium in the body can cause  thirst, increased blood pressure, shortness of breath, and water retention.  Decreasing sodium in the diet can reduce edema and risk of heart attack or stroke associated with high blood pressure.  Keep in mind that there are many other factors involved in these health problems.  Heredity, obesity, lack of exercise, cigarette smoking, stress and what you eat all play a role.  General Guidelines:  Do not add salt at the table or in cooking.  One teaspoon of salt contains over 2 grams of sodium.  Read food labels  Avoid processed and convenience foods  Ask your dietitian before eating any foods not dicussed in the menu planning guidelines  Consult your physician if you wish to use a salt substitute or a sodium containing medication such as antacids.  Limit milk and milk products to 16 oz (2 cups) per day.  Shopping Hints:  READ LABELS!! "Dietetic" does not necessarily mean low sodium.  Salt and other sodium ingredients are often added to foods during processing.   Menu Planning Guidelines Food Group Choose More Often Avoid  Beverages (see also the milk group All fruit juices, low-sodium, salt-free vegetables juices, low-sodium carbonated beverages Regular vegetable or tomato juices, commercially softened water used for drinking or cooking  Breads and Cereals Enriched white, wheat, rye and pumpernickel bread, hard rolls and dinner rolls; muffins, cornbread and waffles; most dry cereals, cooked cereal without added salt; unsalted crackers and breadsticks; low  sodium or homemade bread crumbs Bread, rolls and crackers with salted tops; quick breads; instant hot cereals; pancakes; commercial bread stuffing; self-rising flower and biscuit mixes; regular bread crumbs or cracker crumbs  Desserts and Sweets Desserts and sweets mad with mild should be within allowance Instant pudding mixes and cake mixes  Fats Butter or margarine; vegetable oils; unsalted salad dressings, regular salad dressings limited  to 1 Tbs; light, sour and heavy cream Regular salad dressings containing bacon fat, bacon bits, and salt pork; snack dips made with instant soup mixes or processed cheese; salted nuts  Fruits Most fresh, frozen and canned fruits Fruits processed with salt or sodium-containing ingredient (some dried fruits are processed with sodium sulfites        Vegetables Fresh, frozen vegetables and low- sodium canned vegetables Regular canned vegetables, sauerkraut, pickled vegetables, and others prepared in brine; frozen vegetables in sauces; vegetables seasoned with ham, bacon or salt pork  Condiments, Sauces, Miscellaneous  Salt substitute with physician's approval; pepper, herbs, spices; vinegar, lemon or lime juice; hot pepper sauce; garlic powder, onion powder, low sodium soy sauce (1 Tbs.); low sodium condiments (ketchup, chili sauce, mustard) in limited amounts (1 tsp.) fresh ground horseradish; unsalted tortilla chips, pretzels, potato chips, popcorn, salsa (1/4 cup) Any seasoning made with salt including garlic salt, celery salt, onion salt, and seasoned salt; sea salt, rock salt, kosher salt; meat tenderizers; monosodium glutamate; mustard, regular soy sauce, barbecue, sauce, chili sauce, teriyaki sauce, steak sauce, Worcestershire sauce, and most flavored vinegars; canned gravy and mixes; regular condiments; salted snack foods, olives, picles, relish, horseradish sauce, catsup   Food preparation: Try these seasonings Meats:    Pork Sage, onion Serve with applesauce  Chicken Poultry seasoning, thyme, parsley Serve with cranberry sauce  Lamb Curry powder, rosemary, garlic, thyme Serve with mint sauce or jelly  Veal Marjoram, basil Serve with current jelly, cranberry sauce  Beef Pepper, bay leaf Serve with dry mustard, unsalted chive butter  Fish [place] leaf, dill Serve with unsalted lemon butter, unsalted parsley butter  Vegetables:    Asparagus Lemon juice   Broccoli Lemon juice   Carrots Mustard  dressing parsley, mint, nutmeg, glazed with unsalted butter and sugar   Green beans Marjoram, lemon juice, nutmeg,dill seed   Tomatoes Basil, marjoram, onion   Spice /blend for [employer]" 4 tsp ground thyme 1 tsp ground sage 3 tsp ground rosemary 4 tsp ground marjoram   Test your knowledge 1. A product that says "Salt Free" may still contain sodium. True or False 2. Garlic Powder and Hot Pepper Sauce an be used as alternative seasonings.True or False 3. Processed foods have more sodium than fresh foods.  True or False 4. Canned Vegetables have less sodium than froze True or False  WAYS TO DECREASE YOUR SODIUM INTAKE 1. Avoid the use of added salt in cooking and at the table.  Table salt (and other prepared seasonings which contain salt) is probably one of the greatest sources of sodium in the diet.  Unsalted foods can gain flavor from the sweet, sour, and butter taste sensations of herbs and spices.  Instead of using salt for seasoning, try the following seasonings with the foods listed.  Remember: how you use them to enhance natural food flavors is limited only by your creativity... Allspice-Meat, fish, eggs, fruit, peas, red and yellow vegetables Almond Extract-Fruit baked goods Anise Seed-Sweet breads, fruit, carrots, beets, cottage cheese, cookies (tastes like licorice) Basil-Meat, fish, eggs, vegetables, rice, vegetables salads, soups, sauces Bay  Leaf-Meat, fish, stews, poultry Burnet-Salad, vegetables (cucumber-like flavor) Caraway Seed-Bread, cookies, cottage cheese, meat, vegetables, cheese, rice Cardamon-Baked goods, fruit, soups Celery Powder or seed-Salads, salad dressings, sauces, meatloaf, soup, bread.Do not use  celery salt Chervil-Meats, salads, fish, eggs, vegetables, cottage cheese (parsley-like flavor) Chili Power-Meatloaf, chicken cheese, corn, eggplant, egg dishes Chives-Salads cottage cheese, egg dishes, soups, vegetables, sauces Cilantro-Salsa,  casseroles Cinnamon-Baked goods, fruit, pork, lamb, chicken, carrots Cloves-Fruit, baked goods, fish, pot roast, green beans, beets, carrots Coriander-Pastry, cookies, meat, salads, cheese (lemon-orange flavor) Cumin-Meatloaf, fish,cheese, eggs, cabbage,fruit pie (caraway flavor) [employer], fruit, eggs, fish, poultry, cottage cheese, vegetables Dill Seed-Meat, cottage cheese, poultry, vegetables, fish, salads, bread Fennel Seed-Bread, cookies, apples, pork, eggs, fish, beets, cabbage, cheese, Licorice-like flavor Garlic-(buds or powder) Salads, meat, poultry, fish, bread, butter, vegetables, potatoes.Do not  use garlic salt Ginger-Fruit, vegetables, baked goods, meat, fish, poultry Horseradish Root-Meet, vegetables, butter Lemon Juice or Extract-Vegetables, fruit, tea, baked goods, fish salads Mace-Baked goods fruit, vegetables, fish, poultry (taste like nutmeg) Maple Extract-Syrups Marjoram-Meat, chicken, fish, vegetables, breads, green salads (taste like Sage) Mint-Tea, lamb, sherbet, vegetables, desserts, carrots, cabbage Mustard, Dry or Seed-Cheese, eggs, meats, vegetables, poultry Nutmeg-Baked goods, fruit, chicken, eggs, vegetables, desserts Onion Powder-Meat, fish, poultry, vegetables, cheese, eggs, bread, rice salads (Do not use   Onion salt) Orange Extract-Desserts, baked goods Oregano-Pasta, eggs, cheese, onions, pork, lamb, fish, chicken, vegetables, green salads Paprika-Meat, fish, poultry, eggs, cheese, vegetables Parsley Flakes-Butter, vegetables, meat fish, poultry, eggs, bread, salads (certain forms may   Contain sodium Pepper-Meat fish, poultry, vegetables, eggs Peppermint Extract-Desserts, baked goods Poppy Seed-Eggs, bread, cheese, fruit dressings, baked goods, noodles, vegetables, cottage  [employer], poultry, meat, fish, cauliflower, turnips,eggs bread Saffron-Rice, bread, veal, chicken, fish, eggs Sage-Meat, fish,  poultry, onions, eggplant, tomateos, pork, stews Savory-Eggs, salads, poultry, meat, rice, vegetables, soups, pork Tarragon-Meat, poultry, fish, eggs, butter, vegetables (licorice-like flavor)  Thyme-Meat, poultry, fish, eggs, vegetables, (clover-like flavor), sauces, soups Tumeric-Salads, butter, eggs, fish, rice, vegetables (saffron-like flavor) Vanilla Extract-Baked goods, candy Vinegar-Salads, vegetables, meat marinades Walnut Extract-baked goods, candy  2. Choose your Foods Wisely   The following is a list of foods to avoid which are high in sodium:  Meats-Avoid all smoked, canned, salt cured, dried and kosher meat and fish as well as Anchovies   [employer] meats:Bologna, Liverwurst, Pastrami Canned meat or fish  Marinated herring Caviar    Pepperoni Corned Beef   Pizza Dried chipped beef  Salami Frozen breaded fish or meat Salt pork Frankfurters or hot dogs  Sardines Gefilte fish   Sausage Ham (boiled ham, Proscuitto Smoked butt    spiced ham)   Spam      TV Dinners Vegetables Canned vegetables (Regular) Relish Canned mushrooms  Sauerkraut Olives    Tomato juice Pickles  Bakery and Dessert Products Canned puddings  Cream pies Cheesecake   Decorated cakes Cookies  Beverages/Juices Tomato juice, regular  Gatorade   V-8 vegetable juice, regular  Breads and Cereals Biscuit mixes   Salted potato chips, corn chips, pretzels Bread stuffing mixes  Salted crackers and rolls Pancake and waffle mixes Self-rising flour  Seasonings Accent    Meat sauces Barbecue sauce  Meat tenderizer Catsup    Monosodium glutamate (MSG) Celery salt   Onion salt Chili sauce   Prepared mustard Garlic salt   Salt, seasoned salt, sea salt Gravy mixes   Soy sauce Horseradish   Steak sauce Ketchup   Tartar sauce Lite salt    Teriyaki sauce Marinade  mixes   Worcestershire sauce  Others Baking powder   Cocoa and cocoa mixes Baking soda   Commercial casserole  mixes Candy-caramels, chocolate  Dehydrated soups    Bars, fudge,nougats  Instant rice and pasta mixes Canned broth or soup  Maraschino cherries Cheese, aged and processed cheese and cheese spreads  Learning Assessment Quiz  Indicated T (for True) or F (for False) for each of the following statements:  1. _____ Fresh fruits and vegetables and unprocessed grains are generally low in sodium 2. _____ Water may contain a considerable amount of sodium, depending on the source 3. _____ You can always tell if a food is high in sodium by tasting it 4. _____ Certain laxatives my be high in sodium and should be avoided unless prescribed   by a physician or pharmacist 5. _____ Salt substitutes may be used freely by anyone on a sodium restricted diet 6. _____ Sodium is present in table salt, food additives and as a natural component of   most foods 7. _____ Table salt is approximately 90% sodium 8. _____ Limiting sodium intake may help prevent excess fluid accumulation in the body 9. _____ On a sodium-restricted diet, seasonings such as bouillon soy sauce, and    cooking wine should be used in place of table salt 10. _____ On an ingredient list, a product which lists monosodium glutamate as the first   ingredient is an appropriate food to include on a low sodium diet  Circle the best answer(s) to the following statements (Hint: there may be more than one correct answer)  11. On a low-sodium diet, some acceptable snack items are:    A. Olives  F. Bean dip   K. Grapefruit juice    B. Salted Pretzels G. Commercial Popcorn   L. Canned peaches    C. Carrot Sticks  H. Bouillon   M. Unsalted nuts   D. [place] fries  I. Peanut butter crackers N. Salami   E. Sweet pickles J. Tomato Juice   O. Pizza  12.  Seasonings that may be used freely on a reduced - sodium diet include   A. Lemon wedges F.Monosodium glutamate K. Celery seed    B.Soysauce   G. Pepper   L. Mustard powder   C. Sea salt  H.  Cooking wine  M. Onion flakes   D. Vinegar  E. Prepared horseradish N. Salsa   E. Sage   J. Worcestershire sauce  O. Chutney

## 2019-10-17 ENCOUNTER — Encounter (HOSPITAL_COMMUNITY): Payer: Self-pay | Admitting: *Deleted

## 2019-10-17 ENCOUNTER — Other Ambulatory Visit (HOSPITAL_COMMUNITY)
Admission: RE | Admit: 2019-10-17 | Discharge: 2019-10-17 | Disposition: A | Discharge status: Home / Self Care | Payer: Medicare Other | Source: Ambulatory Visit | Attending: Gastroenterology | Admitting: Gastroenterology

## 2019-10-17 DIAGNOSIS — Z20828 Contact with and (suspected) exposure to other viral communicable diseases: Secondary | ICD-10-CM | POA: Diagnosis not present

## 2019-10-17 DIAGNOSIS — Z01812 Encounter for preprocedural laboratory examination: Secondary | ICD-10-CM | POA: Insufficient documentation

## 2019-10-17 NOTE — Progress Notes (Addendum)
Preop instructions for:   Jody Oneill                      Date of Birth          May 11, 2046                  Date of Procedure:  10/21/2019     Doctor: Dr Oletta Lamas Time to arrive at [facility]: [place] on 10/21/2019 Report to: Admitting  Procedure: colonoscopy  Follow your bowel prep instructions from Dr Oletta Lamas office.Do not eat or drink past midnight the night before your procedure.     Take these morning medications only with sips of water.(or give through gastrostomy or feeding tube).hydralazine, isosorbide, levothyroxine, lisinopril, metoprolol  Note: No Insulin or Diabetic meds should be given or taken the morning of the procedure!   Bring Insurance card and picture ID Leave all jewelry and other valuables at place where living( no metal or rings to be worn) No contact lens Women-no make-up, no lotions,perfumes,powders Men-no colognes,lotions  Any questions day of procedure,call ENDO   [phone]

## 2019-10-18 DIAGNOSIS — R519 Headache, unspecified: Secondary | ICD-10-CM | POA: Diagnosis not present

## 2019-10-18 LAB — NOVEL CORONAVIRUS, NAA (HOSP ORDER, SEND-OUT TO REF LAB; TAT 18-24 HRS): SARS-CoV-2, NAA: NOT DETECTED

## 2019-10-19 NOTE — Anesthesia Preprocedure Evaluation (Addendum)
Anesthesia Evaluation  Patient identified by MRN, date of birth, ID band Patient awake    Reviewed: Allergy & Precautions, NPO status , Patient's Chart, lab work & pertinent test results, reviewed documented beta blocker date and time   History of Anesthesia Complications Negative for: history of anesthetic complications  Airway Mallampati: II  TM Distance: >3 FB Neck ROM: Full    Dental no notable dental hx.    Pulmonary sleep apnea and Continuous Positive Airway Pressure Ventilation ,    Pulmonary exam normal        Cardiovascular hypertension, Pt. on medications and Pt. on home beta blockers +CHF  Normal cardiovascular exam  TTE 2018: mild LVH, EF 22-48%, grade 2 diastolic dysfunction, moderate LAE  RBBB   Neuro/Psych negative neurological ROS  negative psych ROS   GI/Hepatic Neg liver ROS, GERD  Medicated and Controlled,  Endo/Other  diabetes, Type 2, Oral Hypoglycemic AgentsHypothyroidism Morbid obesity  Renal/GU Renal InsufficiencyRenal disease  negative genitourinary   Musculoskeletal  (+) Arthritis ,   Abdominal   Peds  Hematology negative hematology ROS (+)   Anesthesia Other Findings Day of surgery medications reviewed with patient.  Reproductive/Obstetrics negative OB ROS                            Anesthesia Physical Anesthesia Plan  ASA: III  Anesthesia Plan: MAC   Post-op Pain Management:    Induction:   PONV Risk Score and Plan: Treatment may vary due to age or medical condition and Propofol infusion  Airway Management Planned: Natural Airway and Simple Face Mask  Additional Equipment: None  Intra-op Plan:   Post-operative Plan:   Informed Consent: I have reviewed the patients History and Physical, chart, labs and discussed the procedure including the risks, benefits and alternatives for the proposed anesthesia with the patient or authorized representative  who has indicated his/her understanding and acceptance.       Plan Discussed with: CRNA  Anesthesia Plan Comments:        Anesthesia Quick Evaluation

## 2019-10-21 ENCOUNTER — Ambulatory Visit (HOSPITAL_COMMUNITY)
Admission: RE | Admit: 2019-10-21 | Discharge: 2019-10-21 | Disposition: A | Discharge status: Home / Self Care | Payer: Medicare Other | Attending: Gastroenterology | Admitting: Gastroenterology

## 2019-10-21 ENCOUNTER — Other Ambulatory Visit: Payer: Self-pay

## 2019-10-21 ENCOUNTER — Encounter (HOSPITAL_COMMUNITY)
Admission: RE | Disposition: A | Discharge status: Home / Self Care | Payer: Self-pay | Source: Home / Self Care | Attending: Gastroenterology

## 2019-10-21 ENCOUNTER — Ambulatory Visit (HOSPITAL_COMMUNITY): Payer: Medicare Other | Admitting: Anesthesiology

## 2019-10-21 ENCOUNTER — Encounter (HOSPITAL_COMMUNITY): Payer: Self-pay | Admitting: Gastroenterology

## 2019-10-21 DIAGNOSIS — E1122 Type 2 diabetes mellitus with diabetic chronic kidney disease: Secondary | ICD-10-CM | POA: Diagnosis not present

## 2019-10-21 DIAGNOSIS — G4733 Obstructive sleep apnea (adult) (pediatric): Secondary | ICD-10-CM | POA: Insufficient documentation

## 2019-10-21 DIAGNOSIS — E039 Hypothyroidism, unspecified: Secondary | ICD-10-CM | POA: Insufficient documentation

## 2019-10-21 DIAGNOSIS — Z79899 Other long term (current) drug therapy: Secondary | ICD-10-CM | POA: Diagnosis not present

## 2019-10-21 DIAGNOSIS — Z1211 Encounter for screening for malignant neoplasm of colon: Secondary | ICD-10-CM | POA: Diagnosis not present

## 2019-10-21 DIAGNOSIS — I130 Hypertensive heart and chronic kidney disease with heart failure and stage 1 through stage 4 chronic kidney disease, or unspecified chronic kidney disease: Secondary | ICD-10-CM | POA: Insufficient documentation

## 2019-10-21 DIAGNOSIS — N183 Chronic kidney disease, stage 3 unspecified: Secondary | ICD-10-CM | POA: Diagnosis not present

## 2019-10-21 DIAGNOSIS — D125 Benign neoplasm of sigmoid colon: Secondary | ICD-10-CM | POA: Insufficient documentation

## 2019-10-21 DIAGNOSIS — Z6838 Body mass index (BMI) 38.0-38.9, adult: Secondary | ICD-10-CM | POA: Diagnosis not present

## 2019-10-21 DIAGNOSIS — Z86010 Personal history of colonic polyps: Secondary | ICD-10-CM | POA: Insufficient documentation

## 2019-10-21 DIAGNOSIS — Z888 Allergy status to other drugs, medicaments and biological substances status: Secondary | ICD-10-CM | POA: Diagnosis not present

## 2019-10-21 DIAGNOSIS — I5032 Chronic diastolic (congestive) heart failure: Secondary | ICD-10-CM | POA: Diagnosis not present

## 2019-10-21 DIAGNOSIS — K760 Fatty (change of) liver, not elsewhere classified: Secondary | ICD-10-CM | POA: Diagnosis not present

## 2019-10-21 DIAGNOSIS — M1990 Unspecified osteoarthritis, unspecified site: Secondary | ICD-10-CM | POA: Insufficient documentation

## 2019-10-21 DIAGNOSIS — Z79890 Hormone replacement therapy (postmenopausal): Secondary | ICD-10-CM | POA: Insufficient documentation

## 2019-10-21 DIAGNOSIS — M109 Gout, unspecified: Secondary | ICD-10-CM | POA: Diagnosis not present

## 2019-10-21 DIAGNOSIS — Z7984 Long term (current) use of oral hypoglycemic drugs: Secondary | ICD-10-CM | POA: Diagnosis not present

## 2019-10-21 DIAGNOSIS — K219 Gastro-esophageal reflux disease without esophagitis: Secondary | ICD-10-CM | POA: Diagnosis not present

## 2019-10-21 DIAGNOSIS — K635 Polyp of colon: Secondary | ICD-10-CM | POA: Diagnosis not present

## 2019-10-21 HISTORY — PX: COLONOSCOPY WITH PROPOFOL: SHX5780

## 2019-10-21 HISTORY — PX: POLYPECTOMY: SHX5525

## 2019-10-21 LAB — GLUCOSE, CAPILLARY: Glucose-Capillary: 145 mg/dL — ABNORMAL HIGH (ref 70–99)

## 2019-10-21 SURGERY — COLONOSCOPY WITH PROPOFOL
Anesthesia: Monitor Anesthesia Care

## 2019-10-21 MED ORDER — SODIUM CHLORIDE 0.9 % IV SOLN: INTRAVENOUS | Status: DC

## 2019-10-21 MED ORDER — PROPOFOL 10 MG/ML IV BOLUS
INTRAVENOUS | Status: AC
  Filled 2019-10-21: qty 20

## 2019-10-21 MED ORDER — LACTATED RINGERS IV SOLN
INTRAVENOUS | Status: DC
  Administered 2019-10-21: 1000 mL via INTRAVENOUS

## 2019-10-21 MED ORDER — ONDANSETRON HCL 4 MG/2ML IJ SOLN
INTRAMUSCULAR | Status: DC | PRN
  Administered 2019-10-21: 4 mg via INTRAVENOUS

## 2019-10-21 MED ORDER — PROPOFOL 10 MG/ML IV BOLUS
INTRAVENOUS | Status: DC | PRN
  Administered 2019-10-21: 30 mg via INTRAVENOUS
  Administered 2019-10-21: 10 mg via INTRAVENOUS

## 2019-10-21 MED ORDER — PROPOFOL 500 MG/50ML IV EMUL
INTRAVENOUS | Status: DC | PRN
  Administered 2019-10-21: 125 ug/kg/min via INTRAVENOUS

## 2019-10-21 MED ORDER — PROPOFOL 500 MG/50ML IV EMUL
INTRAVENOUS | Status: AC
  Filled 2019-10-21: qty 50

## 2019-10-21 SURGICAL SUPPLY — 22 items

## 2019-10-21 NOTE — Discharge Instructions (Addendum)
No aspirin, ibuprofen or other NSAID medications for 5 days. Office will send note or call when pathology results are obtained. Colonoscopy will be repeated based on the pathology results.     YOU HAD AN ENDOSCOPIC PROCEDURE TODAY: Refer to the procedure report and other information in the discharge instructions given to you for any specific questions about what was found during the examination. If this information does not answer your questions, please call [facility] office at [phone] to clarify.   YOU SHOULD EXPECT: Some feelings of bloating in the abdomen. Passage of more gas than usual. Walking can help get rid of the air that was put into your GI tract during the procedure and reduce the bloating. If you had a lower endoscopy (such as a colonoscopy or flexible sigmoidoscopy) you may notice spotting of blood in your stool or on the toilet paper. Some abdominal soreness may be present for a day or two, also.  DIET: Your first meal following the procedure should be a light meal and then it is ok to progress to your normal diet. A half-sandwich or bowl of soup is an example of a good first meal. Heavy or fried foods are harder to digest and may make you feel nauseous or bloated. Drink plenty of fluids but you should avoid alcoholic beverages for 24 hours. If you had a esophageal dilation, please see attached instructions for diet.   ACTIVITY: Your care partner should take you home directly after the procedure. You should plan to take it easy, moving slowly for the rest of the day. You can resume normal activity the day after the procedure however YOU SHOULD NOT DRIVE, use power tools, machinery or perform tasks that involve climbing or major physical exertion for 24 hours (because of the sedation medicines used during the test).   SYMPTOMS TO REPORT IMMEDIATELY: A gastroenterologist can be reached at any hour. Please call [phone]  for any of the following symptoms:    Following lower  endoscopy (colonoscopy, flexible sigmoidoscopy) Excessive amounts of blood in the stool  Significant tenderness, worsening of abdominal pains  Swelling of the abdomen that is new, acute  Fever of 100 or higher   FOLLOW UP:  If any biopsies were taken you will be contacted by phone or by letter within the next 1-3 weeks. Call [phone]  if you have not heard about the biopsies in 3 weeks.  Please also call with any specific questions about appointments or follow up tests.

## 2019-10-21 NOTE — H&P (Signed)
Subjective:   Patient is a 73 y.o. 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 Procedure including risks and benefits discussed in office.  Patient Active Problem List   Diagnosis Date Noted  . Syncope 09/25/2018  . ARF (acute renal failure) ([place]) 09/25/2018  . Chest pain 09/25/2018  . AKI (acute kidney injury) ([place])   . Essential hypertension 07/28/2017  . Type 2 diabetes mellitus without complications ([place]) 123456  . CKD (chronic kidney disease) stage 3, GFR 30-59 ml/min 07/28/2017  . RBBB (right bundle branch block) 07/28/2017  . Chronic diastolic CHF (congestive heart failure) ([place]) 06/15/2015  . DOE (dyspnea on exertion) 12/02/2014   Past Medical History:  Diagnosis Date  . Chronic diastolic CHF (congestive heart failure) ([place]) 06/15/2015  . Chronic kidney disease   . Fatty liver   . GERD (gastroesophageal reflux disease)   . Gout   . History of echocardiogram    Echo 8/18: mild conc LVH, EF 55-60, no RWMA, Gr 2 DD, mod LAE  . History of nuclear stress test    Nuclear stress test 8/18: EF 65, no ischemia or scar, normal study  . Hypertension   . Hypothyroid   . Obesity    fatty liver by [place]  . OSA on CPAP   . Osteoarthritis   . Type 2 diabetes mellitus ([place])     Past Surgical History:  Procedure Laterality Date  . BIOPSY BREAST Left    many years ago    Medications Prior to Admission  Medication Sig Dispense Refill Last Dose  . acetaminophen (TYLENOL) 325 MG tablet Take 650 mg by mouth every 6 (six) hours as needed for mild pain, moderate pain or headache.   Past Week at Unknown time  . allopurinol (ZYLOPRIM) 300 MG tablet Take 300 mg by mouth every other day.    Past Week at Unknown time  . clobetasol cream (TEMOVATE) [id number] % Apply 1 application topically as needed ([place]).   Past Week at Unknown time  . furosemide (LASIX) 40 MG tablet Take 1 tablet (40 mg total) by mouth daily. 90 tablet 3 Past Week at Unknown time  . GLIPIZIDE  XL 10 MG 24 hr tablet Take 10 mg by mouth daily.   5 Past Week at Unknown time  . hydrALAZINE (APRESOLINE) 50 MG tablet Take 1.5 tablets (75 mg total) by mouth 3 (three) times daily. 270 tablet 2 10/21/2019 at Unknown time  . isosorbide mononitrate (IMDUR) 60 MG 24 hr tablet Take 1.5 tablets (90 mg total) by mouth daily. 135 tablet 3 10/21/2019 at Unknown time  . lansoprazole (PREVACID) 30 MG capsule Take 30 mg by mouth daily as needed (heartburn).   4 10/20/2019 at Unknown time  . levothyroxine (SYNTHROID, LEVOTHROID) 88 MCG tablet Take 88 mcg by mouth daily before breakfast.   10/21/2019 at Unknown time  . lisinopril (PRINIVIL,ZESTRIL) 40 MG tablet Take 1 tablet (40 mg total) by mouth daily. 30 tablet 1 10/21/2019 at Unknown time  . metoprolol succinate (TOPROL XL) 50 MG 24 hr tablet Take 1 tablet (50 mg total) by mouth daily. Take with or immediately following a meal. 30 tablet 11 10/21/2019 at Unknown time  . Multiple Vitamin (MULTIVITAMIN) tablet Take 1 tablet by mouth 2 (two) times daily.    10/20/2019 at Unknown time  . oxymetazoline (NASAL SPRAY 12 HOUR) 0.05 % nasal spray Place 1 spray into both nostrils 2 (two) times daily as needed for congestion.   Past Week at  Unknown time  . potassium chloride (K-DUR) 10 MEQ tablet TAKE 1 TABLET (10 MEQ TOTAL) BY MOUTH 2 (TWO) TIMES DAILY. (Patient taking differently: Take 10 mEq by mouth daily. ) 180 tablet 3 Past Week at Unknown time  . pravastatin (PRAVACHOL) 20 MG tablet Take 20 mg by mouth daily.   Past Week at Unknown time   Allergies  Allergen Reactions  . Amlodipine Besylate Swelling  . Hydrochlorothiazide Other (See Comments)    CAUSES GOUT FLARE UP    Social History   Tobacco Use  . Smoking status: Never Smoker  . Smokeless tobacco: Never Used  Substance Use Topics  . Alcohol use: No    Alcohol/week: 0.0 standard drinks    Frequency: Never    Family History  Problem Relation Age of Onset  . Lung cancer Mother   . Other Father         meningitis  . Diabetes Father   . Breast cancer Maternal Aunt   . Breast cancer Maternal Aunt      Objective:   No data found. No intake/output data recorded. No intake/output data recorded.   See MD Preop evaluation      Assessment:   1.  History of colon polyp.  Patient due for surveillance colonoscopy  Plan:   We will proceed with colonoscopy at this time.  Have discussed this recently extensively in the office with the patient.  Procedure discussed again.

## 2019-10-21 NOTE — Transfer of Care (Signed)
Immediate Anesthesia Transfer of Care Note  Patient: Jody Oneill  Procedure(s) Performed: COLONOSCOPY WITH PROPOFOL (N/A ) POLYPECTOMY  Patient Location: PACU  Anesthesia Type:MAC  Level of Consciousness: awake, alert  and oriented  Airway & Oxygen Therapy: Patient Spontanous Breathing and Patient connected to face mask oxygen  Post-op Assessment: Report given to RN and Post -op Vital signs reviewed and stable  Post vital signs: Reviewed and stable  Last Vitals:  Vitals Value Taken Time  BP 108/34 10/21/19 0841  Temp 36.7 C 10/21/19 0841  Pulse 87 10/21/19 0842  Resp 20 10/21/19 0842  SpO2 100 % 10/21/19 0842  Vitals shown include unvalidated device data.  Last Pain:  Vitals:   10/21/19 0841  TempSrc: Oral  PainSc: 0-No pain         Complications: No apparent anesthesia complications

## 2019-10-21 NOTE — Anesthesia Procedure Notes (Signed)
Procedure Name: MAC Date/Time: 10/21/2019 7:55 AM Performed by: Maxwell Caul, CRNA Pre-anesthesia Checklist: Patient identified, Emergency Drugs available, Suction available and Patient being monitored Oxygen Delivery Method: Simple face mask

## 2019-10-21 NOTE — Anesthesia Postprocedure Evaluation (Signed)
Anesthesia Post Note  Patient: Jody Oneill  Procedure(s) Performed: COLONOSCOPY WITH PROPOFOL (N/A ) POLYPECTOMY     Patient location during evaluation: PACU Anesthesia Type: MAC Level of consciousness: awake and alert and oriented Pain management: pain level controlled Vital Signs Assessment: post-procedure vital signs reviewed and stable Respiratory status: spontaneous breathing, nonlabored ventilation and respiratory function stable Cardiovascular status: blood pressure returned to baseline Postop Assessment: no apparent nausea or vomiting Anesthetic complications: no    Last Vitals:  Vitals:   10/21/19 0730 10/21/19 0841  BP: (!) 189/88 (!) 108/34  Pulse: 82 90  Resp: 20 (!) 21  Temp:  36.7 C  SpO2: 100% 100%    Last Pain:  Vitals:   10/21/19 0841  TempSrc: Oral  PainSc: 0-No pain                 Brennan Bailey

## 2019-10-21 NOTE — Op Note (Signed)
[facility] Patient Name: Jody Oneill Procedure Date: 10/21/2019 MRN: [id number] Attending MD: Nancy Fetter Dr., MD Date of Birth: 04/18/1946 CSN: [id number] Age: 73 Admit Type: Inpatient Procedure:                Colonoscopy Indications:              High risk colon cancer surveillance: Personal                            history of colonic polyps, Last colonoscopy: 2015 Providers:                Joyice Faster. Malick Netz Dr., MD, Carmie End, RN,                            Lina Sar, Technician, Virgia Land, CRNA Referring MD:              Medicines:                Monitored Anesthesia Care Complications:            No immediate complications. Estimated Blood Loss:     Estimated blood loss: none. Procedure:                Pre-Anesthesia Assessment:                           - Prior to the procedure, a History and Physical                            was performed, and patient medications and                            allergies were reviewed. The patient's tolerance of                            previous anesthesia was also reviewed. The risks                            and benefits of the procedure and the sedation                            options and risks were discussed with the patient.                            All questions were answered, and informed consent                            was obtained. Prior Anticoagulants: The patient has                            taken no previous anticoagulant or antiplatelet                            agents. ASA Grade Assessment: III - A patient with  severe systemic disease. After reviewing the risks                            and benefits, the patient was deemed in                            satisfactory condition to undergo the procedure.                           After obtaining informed consent, the colonoscope                            was passed under direct vision. Throughout the                            procedure, the patient's blood pressure, pulse, and                            oxygen saturations were monitored continuously. The                            CF-HQ190L ([id number]) Olympus colonoscope was                            introduced through the anus and advanced to the the                            cecum, identified by appendiceal orifice and                            ileocecal valve. The colonoscopy was performed                            without difficulty. The patient tolerated the                            procedure well. The quality of the bowel                            preparation was good. Anatomical landmarks were                            photographed. Scope In: 8:02:17 AM Scope Out: 8:32:35 AM Scope Withdrawal Time: 0 hours 21 minutes 48 seconds  Total Procedure Duration: 0 hours 30 minutes 18 seconds  Findings:      The perianal and digital rectal examinations were normal.      A 4 mm polyp was found in the sigmoid colon. The polyp was sessile. The       polyp was removed with a cold snare. Resection and retrieval were       complete.      The retroflexed view of the distal rectum and anal verge was normal and       showed no anal or rectal abnormalities.      The exam was otherwise without abnormality. Impression:               -  One 4 mm polyp in the sigmoid colon, removed with                            a cold snare. Resected and retrieved.                           - The distal rectum and anal verge are normal on                            retroflexion view.                           - The examination was otherwise normal.                           - Personal history of colonic polyps. Moderate Sedation:      MAC by anesthesia Recommendation:           - Patient has a contact number available for                            emergencies. The signs and symptoms of potential                            delayed complications were  discussed with the                            patient. Return to normal activities tomorrow.                            Written discharge instructions were provided to the                            patient.                           - Resume previous diet.                           - Continue present medications.                           - No aspirin, ibuprofen, naproxen, or other                            non-steroidal anti-inflammatory drugs for 5 days                            after polyp removal.                           - Repeat colonoscopy for surveillance based on                            pathology results. Procedure Code(s):        --- Professional ---  45385, Colonoscopy, flexible; with removal of                            tumor(s), polyp(s), or other lesion(s) by snare                            technique Diagnosis Code(s):        --- Professional ---                           K63.5, Polyp of colon                           Z86.010, Personal history of colonic polyps CPT copyright 2019 [employer]. All rights reserved. The codes documented in this report are preliminary and upon coder review may  be revised to meet current compliance requirements. Nancy Fetter Dr., MD 10/21/2019 8:39:54 AM This report has been signed electronically. Number of Addenda: 0

## 2019-10-22 LAB — SURGICAL PATHOLOGY

## 2019-10-23 ENCOUNTER — Encounter (HOSPITAL_COMMUNITY): Payer: Self-pay | Admitting: Gastroenterology

## 2019-10-30 ENCOUNTER — Ambulatory Visit
Admission: RE | Admit: 2019-10-30 | Discharge: 2019-10-30 | Disposition: A | Discharge status: Home / Self Care | Payer: Medicare Other | Source: Ambulatory Visit | Attending: Obstetrics and Gynecology | Admitting: Obstetrics and Gynecology

## 2019-10-30 ENCOUNTER — Other Ambulatory Visit: Payer: Self-pay

## 2019-10-30 DIAGNOSIS — M85852 Other specified disorders of bone density and structure, left thigh: Secondary | ICD-10-CM | POA: Diagnosis not present

## 2019-10-30 DIAGNOSIS — Z780 Asymptomatic menopausal state: Secondary | ICD-10-CM | POA: Diagnosis not present

## 2019-10-30 DIAGNOSIS — E2839 Other primary ovarian failure: Secondary | ICD-10-CM

## 2019-11-08 ENCOUNTER — Other Ambulatory Visit: Payer: Self-pay

## 2019-11-08 DIAGNOSIS — Z20822 Contact with and (suspected) exposure to covid-19: Secondary | ICD-10-CM

## 2019-11-08 DIAGNOSIS — Z20828 Contact with and (suspected) exposure to other viral communicable diseases: Secondary | ICD-10-CM | POA: Diagnosis not present

## 2019-11-11 LAB — NOVEL CORONAVIRUS, NAA: SARS-CoV-2, NAA: NOT DETECTED

## 2019-11-13 DIAGNOSIS — R519 Headache, unspecified: Secondary | ICD-10-CM | POA: Diagnosis not present

## 2019-11-13 DIAGNOSIS — R05 Cough: Secondary | ICD-10-CM | POA: Diagnosis not present

## 2020-01-07 ENCOUNTER — Ambulatory Visit (INDEPENDENT_AMBULATORY_CARE_PROVIDER_SITE_OTHER): Payer: Medicare Other | Admitting: Family Medicine

## 2020-01-07 ENCOUNTER — Other Ambulatory Visit: Payer: Self-pay

## 2020-01-07 ENCOUNTER — Encounter: Payer: Self-pay | Admitting: Physician Assistant

## 2020-01-07 VITALS — BP 150/68 | HR 81 | Ht 64.0 in | Wt 222.0 lb

## 2020-01-07 DIAGNOSIS — E119 Type 2 diabetes mellitus without complications: Secondary | ICD-10-CM | POA: Diagnosis not present

## 2020-01-07 DIAGNOSIS — I5032 Chronic diastolic (congestive) heart failure: Secondary | ICD-10-CM

## 2020-01-07 DIAGNOSIS — I10 Essential (primary) hypertension: Secondary | ICD-10-CM

## 2020-01-07 DIAGNOSIS — N1831 Chronic kidney disease, stage 3a: Secondary | ICD-10-CM | POA: Diagnosis not present

## 2020-01-07 MED ORDER — METOPROLOL SUCCINATE ER 100 MG PO TB24: 100.0000 mg | ORAL_TABLET | Freq: Every day | ORAL | 3 refills | Status: DC

## 2020-01-07 NOTE — Patient Instructions (Signed)
Medication Instructions:   Your physician has recommended you make the following change in your medication:   1) Increase your Metoprolol to 100mg , 1 tablet by mouth once a day  *If you need a refill on your cardiac medications before your next appointment, please call your pharmacy*  Lab Work:  None ordered today  If you have labs (blood work) drawn today and your tests are completely normal, you will receive your results only by: Marland Kitchen MyChart Message (if you have MyChart) OR . A paper copy in the mail If you have any lab test that is abnormal or we need to change your treatment, we will call you to review the results.  Testing/Procedures:  None ordered today  Follow-Up: At [facility], you and your health needs are our priority.  As part of our continuing mission to provide you with exceptional heart care, we have created designated Provider Care Teams.  These Care Teams include your primary Cardiologist (physician) and Advanced Practice Providers (APPs -  Physician Assistants and Nurse Practitioners) who all work together to provide you with the care you need, when you need it.  Your next appointment:   6 month(s)  The format for your next appointment:   Either In Person or Virtual  Provider:   You may see Mertie Moores, MD or one of the following Advanced Practice Providers on your designated Care Team:    Richardson Dopp, PA-C  [place], [place]  Daune Perch, [place]

## 2020-01-07 NOTE — Progress Notes (Addendum)
Cardiology Office Note  Date: 01/07/2020   ID: Jody Oneill, DOB 1946/08/11, MRN [id number]  PCP:  Glenis Smoker, MD  Cardiologist:  Mertie Moores, MD Electrophysiologist:  None   Chief Complaint  Patient presents with  . Follow-up    History of Present Illness: Jody Oneill is a 74 y.o. female  Here for f/u for Diastolic CHF. PMH of DM, HTN, CKD, NAFLD, hypothyroidism. At last visit patient voiced no significant episodes of dyspnea, orthopnea, chest discomfort.  BP at last visit remained above goal. Hydralazine was increased to 75 mg TID and isosorbide to 90 mg. There were plans to increase Metoprolol to 100 mg if BP not at goal on next visit.  Lifestyle modifications were emphasized including weight loss , increasing activity, and limiting sodium intake.  Recent colonoscopy October 21, 2019.  A 4 mm polyp was found in the sigmoid colon. The polyp was sessile and removed. The exam was otherwise without abnormality.  She denies any recent acute illnesses, hospitalizations, travels.  Denies any exposures to Covid virus or Covid symptoms.  Denies any progressive dyspnea with or without exertion.  No lower extremity edema complaints.  She states her blood pressures at home systolic runs in the 0000000 150s.  Past Medical History:  Diagnosis Date  . Chronic diastolic CHF (congestive heart failure) ([place]) 06/15/2015  . Chronic kidney disease   . Fatty liver   . GERD (gastroesophageal reflux disease)   . Gout   . History of echocardiogram    Echo 8/18: mild conc LVH, EF 55-60, no RWMA, Gr 2 DD, mod LAE  . History of nuclear stress test    Nuclear stress test 8/18: EF 65, no ischemia or scar, normal study  . Hypertension   . Hypothyroid   . Obesity    fatty liver by [place]  . OSA on CPAP   . Osteoarthritis   . Type 2 diabetes mellitus ([place])     Past Surgical History:  Procedure Laterality Date  . BIOPSY BREAST Left    many years ago  . COLONOSCOPY WITH PROPOFOL N/A  10/21/2019   Procedure: COLONOSCOPY WITH PROPOFOL;  Surgeon: Laurence Spates, MD;  Location: [facility] ENDOSCOPY;  Service: Endoscopy;  Laterality: N/A;  . POLYPECTOMY  10/21/2019   Procedure: POLYPECTOMY;  Surgeon: Laurence Spates, MD;  Location: [facility] ENDOSCOPY;  Service: Endoscopy;;    Current Outpatient Medications  Medication Sig Dispense Refill  . acetaminophen (TYLENOL) 325 MG tablet Take 650 mg by mouth every 6 (six) hours as needed for mild pain, moderate pain or headache.    . allopurinol (ZYLOPRIM) 300 MG tablet Take 300 mg by mouth every other day.     . clobetasol cream (TEMOVATE) [id number] % Apply 1 application topically as needed (Rash).    . furosemide (LASIX) 40 MG tablet Take 1 tablet (40 mg total) by mouth daily. 90 tablet 3  . GLIPIZIDE XL 10 MG 24 hr tablet Take 10 mg by mouth daily.   5  . hydrALAZINE (APRESOLINE) 50 MG tablet Take 1.5 tablets (75 mg total) by mouth 3 (three) times daily. 270 tablet 2  . isosorbide mononitrate (IMDUR) 60 MG 24 hr tablet Take 1.5 tablets (90 mg total) by mouth daily. 135 tablet 3  . lansoprazole (PREVACID) 30 MG capsule Take 30 mg by mouth daily as needed (heartburn).   4  . levothyroxine (SYNTHROID, LEVOTHROID) 88 MCG tablet Take 88 mcg by mouth daily before breakfast.    . lisinopril (PRINIVIL,ZESTRIL) 40 MG  tablet Take 1 tablet (40 mg total) by mouth daily. 30 tablet 1  . metoprolol succinate (TOPROL XL) 50 MG 24 hr tablet Take 1 tablet (50 mg total) by mouth daily. Take with or immediately following a meal. 30 tablet 11  . Multiple Vitamin (MULTIVITAMIN) tablet Take 1 tablet by mouth 2 (two) times daily.     Marland Kitchen oxymetazoline (NASAL SPRAY 12 HOUR) 0.05 % nasal spray Place 1 spray into both nostrils 2 (two) times daily as needed for congestion.    . potassium chloride (K-DUR) 10 MEQ tablet TAKE 1 TABLET (10 MEQ TOTAL) BY MOUTH 2 (TWO) TIMES DAILY. (Patient taking differently: Take 10 mEq by mouth daily. ) 180 tablet 3  . pravastatin (PRAVACHOL) 20 MG tablet  Take 20 mg by mouth daily.     No current facility-administered medications for this visit.   Allergies:  Amlodipine besylate and Hydrochlorothiazide   Social History: The patient  reports that she has never smoked. She has never used smokeless tobacco. She reports that she does not drink alcohol or use drugs.   Family History: The patient's family history includes Breast cancer in her maternal aunt and maternal aunt; Diabetes in her father; Lung cancer in her mother; Other in her father.   ROS:  Please see the history of present illness. Otherwise, complete review of systems is positive for none.  All other systems are reviewed and negative.   Physical Exam: VS:  BP (!) 150/68   Pulse 81   Ht 5\' 4"  (1.626 m)   Wt 222 lb (100.7 kg)   SpO2 98%   BMI 38.11 kg/m , BMI Body mass index is 38.11 kg/m.  Wt Readings from Last 3 Encounters:  01/07/20 222 lb (100.7 kg)  10/21/19 225 lb 15.5 oz (102.5 kg)  10/09/19 226 lb (102.5 kg)    General: Patient appears comfortable at rest. Neck: Supple, no elevated JVP or carotid bruits, no thyromegaly. Lungs: Clear to auscultation, nonlabored breathing at rest. Cardiac: Regular rate and rhythm, no S3 or significant systolic murmur, no pericardial rub. Extremities: No pitting edema, distal pulses 2+. Skin: Warm and dry. Neuropsychiatric: Alert and oriented x3, affect grossly appropriate.  ECG:  An ECG dated 10/10/2019 was personally reviewed today and demonstrated:  Normal sinus rhythm rate of 79, left axis deviation, right bundle branch block.  Recent Labwork:  Recent lab work in August 2020; total cholesterol 154, HDL 56, LDL 78, triglycerides 102, hemoglobin A1c 6.5%, hemoglobin 12.1, creatinine 1.12, potassium 3.9, ALT 15, platelets 212, creatinine clearance 37.8,  Other Studies Reviewed Today:  Nuclear stress test 08/08/17 EF 65, no ischemia or scar, normal study  Echocardiogram 08/07/17 Mild concentric LVH, EF 55-60, normal wall  motion, grade 2 diastolic dysfunction, moderate LAE  Echocardiogram 12/05/14 Mild focal basal septal hypertrophy, EF 55-60, normal wall motion, grade 2 diastolic dysfunction, mild LAE, PASP 36   Assessment and Plan:  1. Chronic diastolic CHF (congestive heart failure) (HCC)   2. Stage 3a chronic kidney disease   3. Essential hypertension   4. Type 2 diabetes mellitus without complication, without long-term current use of insulin ([place])    1. Chronic diastolic CHF (congestive heart failure) ([place]) She denies any recent exertional dyspnea, or lower extremity edema.  No PND or orthopnea.  History of grade 2 diastolic dysfunction on last echocardiogram with normal EF.  2. Stage 3a chronic kidney disease Recent creatinine 1.12.  Creatinine clearance 37.8.  Improved from previous renal function labs January 01, 2019.  3. Essential hypertension Blood pressure remains elevated even after adjustment of isosorbide and hydralazine.  Patient states systolic blood pressure at home runs in the 140s to 150s.  Increase Toprol-XL to 100 mg daily.  Continue hydralazine 75 mg daily, Imdur 90 mg daily, lisinopril 40 mg daily.  Reinforced lifestyle modifications including increasing activity, weight loss, reducing sodium intake.  Continue to monitor blood pressures at home.  Goal of 130/80.  4. Type 2 diabetes mellitus without complication, without long-term current use of insulin (HCC) Recent hemoglobin A1c was 6.5% in August.  Diabetes managed by PCP.  Medication Adjustments/Labs and Tests Ordered: Current medicines are reviewed at length with the patient today.  Concerns regarding medicines are outlined above.    There are no Patient Instructions on file for this visit.       Signed, Levell July, NP 01/07/2020 10:29 AM    [facility], [place], [place] [address] Phone: [phone]; Fax: [phone]

## 2020-01-21 DIAGNOSIS — E1122 Type 2 diabetes mellitus with diabetic chronic kidney disease: Secondary | ICD-10-CM | POA: Diagnosis not present

## 2020-01-21 DIAGNOSIS — N2581 Secondary hyperparathyroidism of renal origin: Secondary | ICD-10-CM | POA: Diagnosis not present

## 2020-01-21 DIAGNOSIS — N183 Chronic kidney disease, stage 3 unspecified: Secondary | ICD-10-CM | POA: Diagnosis not present

## 2020-01-21 DIAGNOSIS — I129 Hypertensive chronic kidney disease with stage 1 through stage 4 chronic kidney disease, or unspecified chronic kidney disease: Secondary | ICD-10-CM | POA: Diagnosis not present

## 2020-02-11 DIAGNOSIS — L918 Other hypertrophic disorders of the skin: Secondary | ICD-10-CM | POA: Diagnosis not present

## 2020-02-11 DIAGNOSIS — E039 Hypothyroidism, unspecified: Secondary | ICD-10-CM | POA: Diagnosis not present

## 2020-02-11 DIAGNOSIS — K219 Gastro-esophageal reflux disease without esophagitis: Secondary | ICD-10-CM | POA: Diagnosis not present

## 2020-02-11 DIAGNOSIS — E1122 Type 2 diabetes mellitus with diabetic chronic kidney disease: Secondary | ICD-10-CM | POA: Diagnosis not present

## 2020-02-11 DIAGNOSIS — E7800 Pure hypercholesterolemia, unspecified: Secondary | ICD-10-CM | POA: Diagnosis not present

## 2020-02-11 DIAGNOSIS — N183 Chronic kidney disease, stage 3 unspecified: Secondary | ICD-10-CM | POA: Diagnosis not present

## 2020-02-11 DIAGNOSIS — I5032 Chronic diastolic (congestive) heart failure: Secondary | ICD-10-CM | POA: Diagnosis not present

## 2020-02-12 DIAGNOSIS — G4733 Obstructive sleep apnea (adult) (pediatric): Secondary | ICD-10-CM | POA: Diagnosis not present

## 2020-02-12 DIAGNOSIS — Z23 Encounter for immunization: Secondary | ICD-10-CM | POA: Diagnosis not present

## 2020-02-20 ENCOUNTER — Other Ambulatory Visit: Payer: Self-pay | Admitting: Nurse Practitioner

## 2020-02-20 MED ORDER — POTASSIUM CHLORIDE ER 10 MEQ PO TBCR: EXTENDED_RELEASE_TABLET | ORAL | 3 refills | Status: DC

## 2020-03-02 ENCOUNTER — Other Ambulatory Visit: Payer: Self-pay | Admitting: Obstetrics and Gynecology

## 2020-03-02 DIAGNOSIS — Z1231 Encounter for screening mammogram for malignant neoplasm of breast: Secondary | ICD-10-CM

## 2020-03-02 DIAGNOSIS — R921 Mammographic calcification found on diagnostic imaging of breast: Secondary | ICD-10-CM

## 2020-03-11 DIAGNOSIS — M859 Disorder of bone density and structure, unspecified: Secondary | ICD-10-CM | POA: Diagnosis not present

## 2020-03-11 DIAGNOSIS — Z01419 Encounter for gynecological examination (general) (routine) without abnormal findings: Secondary | ICD-10-CM | POA: Diagnosis not present

## 2020-03-11 DIAGNOSIS — Z124 Encounter for screening for malignant neoplasm of cervix: Secondary | ICD-10-CM | POA: Diagnosis not present

## 2020-04-02 ENCOUNTER — Other Ambulatory Visit: Payer: Self-pay | Admitting: Physician Assistant

## 2020-04-13 ENCOUNTER — Other Ambulatory Visit: Payer: Self-pay | Admitting: Obstetrics and Gynecology

## 2020-04-13 DIAGNOSIS — R921 Mammographic calcification found on diagnostic imaging of breast: Secondary | ICD-10-CM

## 2020-04-15 ENCOUNTER — Ambulatory Visit: Payer: Medicare Other

## 2020-04-15 ENCOUNTER — Ambulatory Visit
Admission: RE | Admit: 2020-04-15 | Discharge: 2020-04-15 | Disposition: A | Discharge status: Home / Self Care | Payer: Medicare Other | Source: Ambulatory Visit | Attending: Obstetrics and Gynecology | Admitting: Obstetrics and Gynecology

## 2020-04-15 ENCOUNTER — Other Ambulatory Visit: Payer: Self-pay

## 2020-04-15 DIAGNOSIS — R921 Mammographic calcification found on diagnostic imaging of breast: Secondary | ICD-10-CM | POA: Diagnosis not present

## 2020-07-06 ENCOUNTER — Ambulatory Visit: Payer: Medicare Other | Admitting: Cardiovascular Disease

## 2020-07-31 ENCOUNTER — Ambulatory Visit (INDEPENDENT_AMBULATORY_CARE_PROVIDER_SITE_OTHER): Payer: Medicare Other | Admitting: Cardiovascular Disease

## 2020-07-31 ENCOUNTER — Other Ambulatory Visit: Payer: Self-pay

## 2020-07-31 ENCOUNTER — Encounter: Payer: Self-pay | Admitting: Cardiovascular Disease

## 2020-07-31 VITALS — BP 132/58 | HR 72 | Ht 64.0 in | Wt 226.4 lb

## 2020-07-31 DIAGNOSIS — I5032 Chronic diastolic (congestive) heart failure: Secondary | ICD-10-CM

## 2020-07-31 LAB — BASIC METABOLIC PANEL
BUN/Creatinine Ratio: 29 — ABNORMAL HIGH (ref 12–28)
BUN: 40 mg/dL — ABNORMAL HIGH (ref 8–27)
CO2: 23 mmol/L (ref 20–29)
Calcium: 9.7 mg/dL (ref 8.7–10.3)
Chloride: 101 mmol/L (ref 96–106)
Creatinine, Ser: 1.4 mg/dL — ABNORMAL HIGH (ref 0.57–1.00)
GFR calc Af Amer: 43 mL/min/{1.73_m2} — ABNORMAL LOW (ref >59)
GFR calc non Af Amer: 37 mL/min/{1.73_m2} — ABNORMAL LOW (ref >59)
Glucose: 187 mg/dL — ABNORMAL HIGH (ref 65–99)
Potassium: 4 mmol/L (ref 3.5–5.2)
Sodium: 138 mmol/L (ref 134–144)

## 2020-07-31 MED ORDER — FUROSEMIDE 40 MG PO TABS: 40.0000 mg | ORAL_TABLET | Freq: Every day | ORAL | 3 refills | Status: DC

## 2020-07-31 NOTE — Patient Instructions (Signed)
Medication Instructions:  Your provider recommends that you continue on your current medications as directed. Please refer to the Current Medication list given to you today.   *If you need a refill on your cardiac medications before your next appointment, please call your pharmacy*  Lab Work: TODAY! BMET If you have labs (blood work) drawn today and your tests are completely normal, you will receive your results only by: Marland Kitchen MyChart Message (if you have MyChart) OR . A paper copy in the mail If you have any lab test that is abnormal or we need to change your treatment, we will call you to review the results.  Follow-Up: At [facility], you and your health needs are our priority.  As part of our continuing mission to provide you with exceptional heart care, we have created designated Provider Care Teams.  These Care Teams include your primary Cardiologist (physician) and Advanced Practice Providers (APPs -  Physician Assistants and Nurse Practitioners) who all work together to provide you with the care you need, when you need it. Your next appointment:   12 month(s) The format for your next appointment:   In Person Provider:   Richardson Dopp, PA-C

## 2020-07-31 NOTE — Progress Notes (Signed)
Cardiology Office Note:    Date:  07/31/2020   ID:  Jody Oneill, DOB 06-Oct-1946, MRN [id number]  PCP:  Glenis Smoker, MD  Cardiologist:  Ayvion Kavanagh   Referring MD: Glenis Smoker, *   No chief complaint on file.     April 02, 2018     Jody Oneill is a 74 y.o. female with a hx of chronic diastolic congestive heart failure, obstructive sleep apnea, type 2 diabetes mellitus, hypertension, chronic kidney disease.  She was last seen in our office in September, 2018 by Richardson Dopp, PA.  She has had significant shortness of breath.  Echocardiogram performed in 2018 reveals normal left ventricular systolic function with moderate diastolic dysfunction.  Nuclear stress test was low risk without ischemia.  BP is high this am.  Has been eating out more recently  Keeps a record of her BP .  Typical BP is 145-150  Goes to the [facility] - does silver sneakers and does water aerobics  No CP or dyspnea  - has some DOE when climbing steps .    January 01, 2019: Seen today for follow-up of her congestive heart failure, hypertension, obesity.  She also has diabetes mellitus.  Wt today is 215 lbs - down from 224 in April , 2019 She is getting some exercise.  She overall seems to be feeling quite a bit better.  July 31, 2020: Jody Oneill is seen today for follow-up of her chronic diastolic congestive heart failure, hypertension, obesity.  She also has diabetes mellitus.  Weight today is 226 which is up 11 pounds from last year. Feeling well  Does water aerobics 3 days a week ,  She cannot swim  Can only walk slowly .   Suggested stationary bike  Eating lots of carbs.      Past Medical History:  Diagnosis Date  . Chronic diastolic CHF (congestive heart failure) ([place]) 06/15/2015  . Chronic kidney disease   . Fatty liver   . GERD (gastroesophageal reflux disease)   . Gout   . History of echocardiogram    Echo 8/18: mild conc LVH, EF 55-60, no RWMA, Gr 2 DD, mod LAE  . History of  nuclear stress test    Nuclear stress test 8/18: EF 65, no ischemia or scar, normal study  . Hypertension   . Hypothyroid   . Obesity    fatty liver by [place]  . OSA on CPAP   . Osteoarthritis   . Type 2 diabetes mellitus ([place])     Past Surgical History:  Procedure Laterality Date  . BIOPSY BREAST Left    many years ago  . COLONOSCOPY WITH PROPOFOL N/A 10/21/2019   Procedure: COLONOSCOPY WITH PROPOFOL;  Surgeon: Laurence Spates, MD;  Location: [facility];  Service: Endoscopy;  Laterality: N/A;  . POLYPECTOMY  10/21/2019   Procedure: POLYPECTOMY;  Surgeon: Laurence Spates, MD;  Location: [facility];  Service: Endoscopy;;    Current Medications: Current Meds  Medication Sig  . acetaminophen (TYLENOL) 325 MG tablet Take 650 mg by mouth every 6 (six) hours as needed for mild pain, moderate pain or headache.  . allopurinol (ZYLOPRIM) 300 MG tablet Take 300 mg by mouth every other day.   . clobetasol cream (TEMOVATE) 2.13 % Apply 1 application topically as needed (Rash).  . FREESTYLE LITE test strip 1 each by Other route daily.  . furosemide (LASIX) 40 MG tablet Take 1 tablet (40 mg total) by mouth daily.  Marland Kitchen GLIPIZIDE XL 10 MG 24 hr  tablet Take 10 mg by mouth daily.   . hydrALAZINE (APRESOLINE) 50 MG tablet Take 50 mg by mouth 3 (three) times daily.  . isosorbide mononitrate (IMDUR) 60 MG 24 hr tablet Take 1.5 tablets (90 mg total) by mouth daily.  . Lancets (FREESTYLE) lancets 1 each by Other route 2 (two) times daily.  . lansoprazole (PREVACID) 30 MG capsule Take 30 mg by mouth daily as needed (heartburn).   Marland Kitchen levothyroxine (SYNTHROID, LEVOTHROID) 88 MCG tablet Take 88 mcg by mouth daily before breakfast.  . lisinopril (PRINIVIL,ZESTRIL) 40 MG tablet Take 1 tablet (40 mg total) by mouth daily.  . metoprolol succinate (TOPROL XL) 100 MG 24 hr tablet Take 1 tablet (100 mg total) by mouth daily. Take with or immediately following a meal.  . Multiple Vitamin (MULTIVITAMIN) tablet Take 1  tablet by mouth 2 (two) times daily.   Marland Kitchen oxymetazoline (NASAL SPRAY 12 HOUR) 0.05 % nasal spray Place 1 spray into both nostrils 2 (two) times daily as needed for congestion.  . potassium chloride (KLOR-CON) 10 MEQ tablet TAKE 1 TABLET (10 MEQ TOTAL) BY MOUTH 2 (TWO) TIMES DAILY.  . pravastatin (PRAVACHOL) 20 MG tablet Take 20 mg by mouth daily.  . [DISCONTINUED] furosemide (LASIX) 40 MG tablet Take 1 tablet (40 mg total) by mouth daily.     Allergies:   Amlodipine besylate and Hydrochlorothiazide   Social History   Socioeconomic History  . Marital status: Widowed    Spouse name: Not on file  . Number of children: Not on file  . Years of education: Not on file  . Highest education level: Not on file  Occupational History  . Not on file  Tobacco Use  . Smoking status: Never Smoker  . Smokeless tobacco: Never Used  Vaping Use  . Vaping Use: Never used  Substance and Sexual Activity  . Alcohol use: No    Alcohol/week: 0.0 standard drinks  . Drug use: No  . Sexual activity: Not on file  Other Topics Concern  . Not on file  Social History Narrative   Tobacco use  Cigarettes: Never smoked ,Tobacco history updated 10/23/2014   Alcohol  : yes occasionally . Caffeine  Yes, rare no recreational drugs .   Exercise Yes   -three times a week water aerobics   Occupation unemployed /retired   Martial status single widowed (2006)    2 daughters    Seat belt use - yes      Social Determinants of Radio broadcast assistant Strain:   . Difficulty of Paying Living Expenses:   Food Insecurity:   . Worried About Charity fundraiser in the Last Year:   . Arboriculturist in the Last Year:   Transportation Needs:   . Film/video editor (Medical):   Marland Kitchen Lack of Transportation (Non-Medical):   Physical Activity:   . Days of Exercise per Week:   . Minutes of Exercise per Session:   Stress:   . Feeling of Stress :   Social Connections:   . Frequency of Communication with Friends and  Family:   . Frequency of Social Gatherings with Friends and Family:   . Attends Religious Services:   . Active Member of Clubs or Organizations:   . Attends Archivist Meetings:   Marland Kitchen Marital Status:      Family History: The patient's family history includes Breast cancer in her maternal aunt and maternal aunt; Diabetes in her father; Lung cancer in  her mother; Other in her father.  ROS:   Please see the history of present illness.     All other systems reviewed and are negative.  EKGs/Labs/Other Studies Reviewed:    The following studies were reviewed today:     Recent Labs: No results found for requested labs within last 8760 hours.  Recent Lipid Panel No results found for: CHOL, TRIG, HDL, CHOLHDL, VLDL, LDLCALC, LDLDIRECT   Physical Exam: Blood pressure (!) 132/58, pulse 72, height 5\' 4"  (1.626 m), weight 226 lb 6.4 oz (102.7 kg), SpO2 97 %.  GEN:  Well nourished, well developed in no acute distress, moderately obese female  HEENT: Normal NECK: No JVD; No carotid bruits LYMPHATICS: No lymphadenopathy CARDIAC: RRR , no murmurs, rubs, gallops RESPIRATORY:  Clear to auscultation without rales, wheezing or rhonchi  ABDOMEN: Soft, non-tender, non-distended MUSCULOSKELETAL:  No edema; No deformity  SKIN: Warm and dry NEUROLOGIC:  Alert and oriented x 3   EKG:   July 31, 2020: Normal sinus rhythm at 72.  Right bundle branch block.  No changes.   ASSESSMENT:    1. Chronic diastolic CHF (congestive heart failure) (HCC)    PLAN:    In order of problems listed above:  1. Chronic diastolic congestive heart failure: Continue current medications.  Have advised her to work on weight loss.  She eats lots of carbohydrates.  Of advised her to stay away from things that are white, wheat, sweet. BMP today    2.  Essential hypertension -  BP is well controlled     Medication Adjustments/Labs and Tests Ordered: Current medicines are reviewed at length with the  patient today.  Concerns regarding medicines are outlined above.  Orders Placed This Encounter  Procedures  . Basic metabolic panel  . EKG 12-Lead   Meds ordered this encounter  Medications  . furosemide (LASIX) 40 MG tablet    Sig: Take 1 tablet (40 mg total) by mouth daily.    Dispense:  90 tablet    Refill:  3      Signed, Mertie Moores, MD  07/31/2020 10:05 AM    [place]

## 2020-08-11 DIAGNOSIS — E1122 Type 2 diabetes mellitus with diabetic chronic kidney disease: Secondary | ICD-10-CM | POA: Diagnosis not present

## 2020-08-11 DIAGNOSIS — N183 Chronic kidney disease, stage 3 unspecified: Secondary | ICD-10-CM | POA: Diagnosis not present

## 2020-08-11 DIAGNOSIS — Z7984 Long term (current) use of oral hypoglycemic drugs: Secondary | ICD-10-CM | POA: Diagnosis not present

## 2020-08-11 DIAGNOSIS — Z0000 Encounter for general adult medical examination without abnormal findings: Secondary | ICD-10-CM | POA: Diagnosis not present

## 2020-08-11 DIAGNOSIS — E039 Hypothyroidism, unspecified: Secondary | ICD-10-CM | POA: Diagnosis not present

## 2020-08-11 DIAGNOSIS — I10 Essential (primary) hypertension: Secondary | ICD-10-CM | POA: Diagnosis not present

## 2020-08-11 DIAGNOSIS — G4733 Obstructive sleep apnea (adult) (pediatric): Secondary | ICD-10-CM | POA: Diagnosis not present

## 2020-08-11 DIAGNOSIS — E7800 Pure hypercholesterolemia, unspecified: Secondary | ICD-10-CM | POA: Diagnosis not present

## 2020-08-31 ENCOUNTER — Other Ambulatory Visit: Payer: Self-pay

## 2020-08-31 NOTE — Patient Outreach (Signed)
[facility]) Care Management  08/31/2020  Jody Oneill 20-Jul-1946 [id number]   New referral from MD office for medication assistance.  Patient reports that she was put on Jardiance for Dm and is not able to afford medication.    Patient reports that she is able to afford all other medications.  Denies any other concerns. Denies any recent falls.  Denies using a walker or cane. Patient reports she uses a pill planner.  Denies any recent weight loss. Reports following a low salt and DM diet.  Reports self monitoring CBG range of 125-150.    Denies any nursing needs at this time.  PLAN: placed referral to [facility].  Case closed for nursing.  Tomasa Rand, RN, BSN, CEN [facility] [employer] [phone]

## 2020-09-01 NOTE — Patient Outreach (Signed)
Referral from Tomasa Rand, RN to [facility] for Medication Assistance.

## 2020-09-02 DIAGNOSIS — Z23 Encounter for immunization: Secondary | ICD-10-CM | POA: Diagnosis not present

## 2020-09-24 ENCOUNTER — Other Ambulatory Visit: Payer: Medicare Other

## 2020-09-24 DIAGNOSIS — Z20822 Contact with and (suspected) exposure to covid-19: Secondary | ICD-10-CM

## 2020-09-26 LAB — NOVEL CORONAVIRUS, NAA: SARS-CoV-2, NAA: NOT DETECTED

## 2020-09-26 LAB — SARS-COV-2, NAA 2 DAY TAT

## 2020-10-09 DIAGNOSIS — Z23 Encounter for immunization: Secondary | ICD-10-CM | POA: Diagnosis not present

## 2020-10-11 ENCOUNTER — Other Ambulatory Visit: Payer: Self-pay | Admitting: Physician Assistant

## 2020-11-25 DIAGNOSIS — I10 Essential (primary) hypertension: Secondary | ICD-10-CM | POA: Diagnosis not present

## 2020-11-25 DIAGNOSIS — Z7984 Long term (current) use of oral hypoglycemic drugs: Secondary | ICD-10-CM | POA: Diagnosis not present

## 2020-11-25 DIAGNOSIS — E1122 Type 2 diabetes mellitus with diabetic chronic kidney disease: Secondary | ICD-10-CM | POA: Diagnosis not present

## 2020-11-30 DIAGNOSIS — R944 Abnormal results of kidney function studies: Secondary | ICD-10-CM | POA: Diagnosis not present

## 2020-12-21 DIAGNOSIS — N183 Chronic kidney disease, stage 3 unspecified: Secondary | ICD-10-CM | POA: Diagnosis not present

## 2021-01-03 ENCOUNTER — Other Ambulatory Visit: Payer: Self-pay | Admitting: Family Medicine

## 2021-01-03 DIAGNOSIS — I10 Essential (primary) hypertension: Secondary | ICD-10-CM

## 2021-01-22 DIAGNOSIS — I129 Hypertensive chronic kidney disease with stage 1 through stage 4 chronic kidney disease, or unspecified chronic kidney disease: Secondary | ICD-10-CM | POA: Diagnosis not present

## 2021-01-22 DIAGNOSIS — N1831 Chronic kidney disease, stage 3a: Secondary | ICD-10-CM | POA: Diagnosis not present

## 2021-01-22 DIAGNOSIS — N2581 Secondary hyperparathyroidism of renal origin: Secondary | ICD-10-CM | POA: Diagnosis not present

## 2021-01-22 DIAGNOSIS — I5030 Unspecified diastolic (congestive) heart failure: Secondary | ICD-10-CM | POA: Diagnosis not present

## 2021-02-12 DIAGNOSIS — E1122 Type 2 diabetes mellitus with diabetic chronic kidney disease: Secondary | ICD-10-CM | POA: Diagnosis not present

## 2021-02-12 DIAGNOSIS — E7800 Pure hypercholesterolemia, unspecified: Secondary | ICD-10-CM | POA: Diagnosis not present

## 2021-02-12 DIAGNOSIS — Z7984 Long term (current) use of oral hypoglycemic drugs: Secondary | ICD-10-CM | POA: Diagnosis not present

## 2021-02-12 DIAGNOSIS — E039 Hypothyroidism, unspecified: Secondary | ICD-10-CM | POA: Diagnosis not present

## 2021-02-17 DIAGNOSIS — G4733 Obstructive sleep apnea (adult) (pediatric): Secondary | ICD-10-CM | POA: Diagnosis not present

## 2021-02-23 DIAGNOSIS — E1122 Type 2 diabetes mellitus with diabetic chronic kidney disease: Secondary | ICD-10-CM | POA: Diagnosis not present

## 2021-02-23 DIAGNOSIS — E039 Hypothyroidism, unspecified: Secondary | ICD-10-CM | POA: Diagnosis not present

## 2021-02-28 ENCOUNTER — Other Ambulatory Visit: Payer: Self-pay | Admitting: Cardiovascular Disease

## 2021-03-08 ENCOUNTER — Other Ambulatory Visit: Payer: Self-pay | Admitting: Obstetrics and Gynecology

## 2021-03-08 DIAGNOSIS — Z0000 Encounter for general adult medical examination without abnormal findings: Secondary | ICD-10-CM

## 2021-03-23 DIAGNOSIS — E119 Type 2 diabetes mellitus without complications: Secondary | ICD-10-CM | POA: Diagnosis not present

## 2021-04-15 DIAGNOSIS — Z23 Encounter for immunization: Secondary | ICD-10-CM | POA: Diagnosis not present

## 2021-04-29 ENCOUNTER — Ambulatory Visit: Payer: Medicare Other

## 2021-05-17 ENCOUNTER — Encounter (HOSPITAL_COMMUNITY): Payer: Self-pay | Admitting: Emergency Medicine

## 2021-05-17 ENCOUNTER — Other Ambulatory Visit: Payer: Self-pay

## 2021-05-17 ENCOUNTER — Emergency Department (HOSPITAL_COMMUNITY): Payer: Medicare Other

## 2021-05-17 ENCOUNTER — Emergency Department (HOSPITAL_COMMUNITY)
Admission: EM | Admit: 2021-05-17 | Discharge: 2021-05-17 | Disposition: A | Discharge status: Home Health | Payer: Medicare Other | Attending: Emergency Medicine | Admitting: Emergency Medicine

## 2021-05-17 DIAGNOSIS — I5032 Chronic diastolic (congestive) heart failure: Secondary | ICD-10-CM | POA: Insufficient documentation

## 2021-05-17 DIAGNOSIS — R0602 Shortness of breath: Secondary | ICD-10-CM

## 2021-05-17 DIAGNOSIS — N183 Chronic kidney disease, stage 3 unspecified: Secondary | ICD-10-CM | POA: Diagnosis not present

## 2021-05-17 DIAGNOSIS — I130 Hypertensive heart and chronic kidney disease with heart failure and stage 1 through stage 4 chronic kidney disease, or unspecified chronic kidney disease: Secondary | ICD-10-CM | POA: Insufficient documentation

## 2021-05-17 DIAGNOSIS — Z20822 Contact with and (suspected) exposure to covid-19: Secondary | ICD-10-CM | POA: Insufficient documentation

## 2021-05-17 DIAGNOSIS — E1122 Type 2 diabetes mellitus with diabetic chronic kidney disease: Secondary | ICD-10-CM | POA: Diagnosis not present

## 2021-05-17 DIAGNOSIS — R059 Cough, unspecified: Secondary | ICD-10-CM | POA: Diagnosis not present

## 2021-05-17 DIAGNOSIS — Z79899 Other long term (current) drug therapy: Secondary | ICD-10-CM | POA: Diagnosis not present

## 2021-05-17 DIAGNOSIS — E039 Hypothyroidism, unspecified: Secondary | ICD-10-CM | POA: Diagnosis not present

## 2021-05-17 DIAGNOSIS — Z7984 Long term (current) use of oral hypoglycemic drugs: Secondary | ICD-10-CM | POA: Insufficient documentation

## 2021-05-17 LAB — COMPREHENSIVE METABOLIC PANEL
ALT: 18 U/L (ref 0–44)
AST: 26 U/L (ref 15–41)
Albumin: 3.9 g/dL (ref 3.5–5.0)
Alkaline Phosphatase: 72 U/L (ref 38–126)
Anion gap: 7 (ref 5–15)
BUN: 30 mg/dL — ABNORMAL HIGH (ref 8–23)
CO2: 23 mmol/L (ref 22–32)
Calcium: 9.4 mg/dL (ref 8.9–10.3)
Chloride: 106 mmol/L (ref 98–111)
Creatinine, Ser: 1.37 mg/dL — ABNORMAL HIGH (ref 0.44–1.00)
GFR, Estimated: 40 mL/min — ABNORMAL LOW (ref >60)
Glucose, Bld: 127 mg/dL — ABNORMAL HIGH (ref 70–99)
Potassium: 4.6 mmol/L (ref 3.5–5.1)
Sodium: 136 mmol/L (ref 135–145)
Total Bilirubin: 0.4 mg/dL (ref 0.3–1.2)
Total Protein: 7.3 g/dL (ref 6.5–8.1)

## 2021-05-17 LAB — CBC WITH DIFFERENTIAL/PLATELET
Abs Immature Granulocytes: 0.04 10*3/uL (ref 0.00–0.07)
Basophils Absolute: 0 10*3/uL (ref 0.0–0.1)
Basophils Relative: 1 %
Eosinophils Absolute: 0.3 10*3/uL (ref 0.0–0.5)
Eosinophils Relative: 5 %
HCT: 36.5 % (ref 36.0–46.0)
Hemoglobin: 12 g/dL (ref 12.0–15.0)
Immature Granulocytes: 1 %
Lymphocytes Relative: 23 %
Lymphs Abs: 1.4 10*3/uL (ref 0.7–4.0)
MCH: 28.8 pg (ref 26.0–34.0)
MCHC: 32.9 g/dL (ref 30.0–36.0)
MCV: 87.7 fL (ref 80.0–100.0)
Monocytes Absolute: 0.8 10*3/uL (ref 0.1–1.0)
Monocytes Relative: 13 %
Neutro Abs: 3.7 10*3/uL (ref 1.7–7.7)
Neutrophils Relative %: 57 %
Platelets: 224 10*3/uL (ref 150–400)
RBC: 4.16 MIL/uL (ref 3.87–5.11)
RDW: 15.1 % (ref 11.5–15.5)
WBC: 6.2 10*3/uL (ref 4.0–10.5)
nRBC: 0.3 % — ABNORMAL HIGH (ref 0.0–0.2)

## 2021-05-17 LAB — RESP PANEL BY RT-PCR (FLU A&B, COVID) ARPGX2
Influenza A by PCR: NEGATIVE
Influenza B by PCR: NEGATIVE
SARS Coronavirus 2 by RT PCR: NEGATIVE

## 2021-05-17 LAB — BRAIN NATRIURETIC PEPTIDE: B Natriuretic Peptide: 53.5 pg/mL (ref 0.0–100.0)

## 2021-05-17 LAB — TROPONIN I (HIGH SENSITIVITY): Troponin I (High Sensitivity): 4 ng/L (ref <18)

## 2021-05-17 MED ORDER — ALBUTEROL SULFATE HFA 108 (90 BASE) MCG/ACT IN AERS: 2.0000 | INHALATION_SPRAY | RESPIRATORY_TRACT | Status: DC | PRN

## 2021-05-17 NOTE — ED Provider Notes (Signed)
I personally evaluated the patient during the encounter and completed a history, physical, procedures, medical decision making to contribute to the overall care of the patient and decision making for the patient briefly, the patient is a 75 y.o. female with history of diabetes, CKD, heart failure who presents the ED with cough, shortness of breath.  Overall mildly symptomatic.  Unremarkable vitals.  Chest x-ray shows no evidence of pneumonia.  EKG shows sinus rhythm.  Troponin normal.  BNP normal.  No other significant electrolyte abnormality or anemia.  Patient likely with a resolving cold.  COVID and flu test are negative.  She is very well-appearing.  No concern for ACS or PE.  Given reassurance and discharged.   EKG Interpretation  Date/Time:  Monday May 17 2021 17:35:49 EDT Ventricular Rate:  67 PR Interval:  196 QRS Duration: 153 QT Interval:  454 QTC Calculation: 480 R Axis:   -30 Text Interpretation: Sinus rhythm Right bundle branch block Confirmed by Lennice Sites ([id number]) on 05/17/2021 7:07:14 PM           Lennice Sites, DO 05/17/21 [id number]

## 2021-05-17 NOTE — ED Notes (Signed)
Patient ambulated without difficulty- heart rate 75 Oxygen saturation 96-98 % while walking

## 2021-05-17 NOTE — ED Provider Notes (Signed)
[facility] DEPT Provider Note   CSN: [id number] Arrival date & time: 05/17/21  1723     History Chief Complaint  Patient presents with  . Shortness of Breath    Jody Oneill is a 75 y.o. female.  HPI 75 year old female with a history of CHF with an EF of 3 to 60% as of 2018, GERD, hypertension, DM type II [place] to the ER with complaints of shortness of breath.  Patient states that she has been getting over a viral URI (has not been tested for COVID), overall feeling well but does have a lingering cough.  She states today she was sitting in her chair and started to develop shortness of breath at rest.  This lasted about an hour, and did improve.  She is currently not short of breath.  She denies any significant leg swelling.  No chest pain.  No pleuritic symptoms.  No known fevers or chills.  She also endorses a tickle in her throat.    Past Medical History:  Diagnosis Date  . Chronic diastolic CHF (congestive heart failure) ([place]) 06/15/2015  . Chronic kidney disease   . Fatty liver   . GERD (gastroesophageal reflux disease)   . Gout   . History of echocardiogram    Echo 8/18: mild conc LVH, EF 55-60, no RWMA, Gr 2 DD, mod LAE  . History of nuclear stress test    Nuclear stress test 8/18: EF 65, no ischemia or scar, normal study  . Hypertension   . Hypothyroid   . Obesity    fatty liver by [place]  . OSA on CPAP   . Osteoarthritis   . Type 2 diabetes mellitus [facility])     Patient Active Problem List   Diagnosis Date Noted  . Syncope 09/25/2018  . ARF (acute renal failure) ([place]) 09/25/2018  . Chest pain 09/25/2018  . AKI (acute kidney injury) ([place])   . Essential hypertension 07/28/2017  . Type 2 diabetes mellitus without complications ([place]) 09/32/3557  . CKD (chronic kidney disease) stage 3, GFR 30-59 ml/min (HCC) 07/28/2017  . RBBB (right bundle branch block) 07/28/2017  . Chronic diastolic CHF (congestive heart failure) ([place]) 06/15/2015  .  DOE (dyspnea on exertion) 12/02/2014    Past Surgical History:  Procedure Laterality Date  . BIOPSY BREAST Left    many years ago  . COLONOSCOPY WITH PROPOFOL N/A 10/21/2019   Procedure: COLONOSCOPY WITH PROPOFOL;  Surgeon: Laurence Spates, MD;  [facility];  Service: Endoscopy;  Laterality: N/A;  . POLYPECTOMY  10/21/2019   Procedure: POLYPECTOMY;  Surgeon: Laurence Spates, MD;  [facility];  Service: Endoscopy;;     OB History   No obstetric history on file.     Family History  Problem Relation Age of Onset  . Lung cancer Mother   . Other Father        meningitis  . Diabetes Father   . Breast cancer Maternal Aunt   . Breast cancer Maternal Aunt     Social History   Tobacco Use  . Smoking status: Never Smoker  . Smokeless tobacco: Never Used  Vaping Use  . Vaping Use: Never used  Substance Use Topics  . Alcohol use: No    Alcohol/week: 0.0 standard drinks  . Drug use: No    Home Medications Prior to Admission medications   Medication Sig Start Date End Date Taking? Authorizing Provider  acetaminophen (TYLENOL) 325 MG tablet Take 650 mg by mouth every 6 (six)  hours as needed for mild pain, moderate pain or headache.    [provider]  allopurinol (ZYLOPRIM) 300 MG tablet Take 300 mg by mouth every other day.     [provider]  clobetasol cream (TEMOVATE) 2.58 % Apply 1 application topically as needed (Rash).    [provider]  FREESTYLE LITE test strip 1 each by Other route daily. 03/23/20   [provider]  furosemide (LASIX) 40 MG tablet Take 1 tablet (40 mg total) by mouth daily. 07/31/20   Nahser, Wonda Cheng, MD  GLIPIZIDE XL 10 MG 24 hr tablet Take 10 mg by mouth daily.  06/01/15   [provider]  hydrALAZINE (APRESOLINE) 50 MG tablet Take 50 mg by mouth 3 (three) times daily.    [provider]  isosorbide mononitrate (IMDUR) 60 MG 24 hr tablet TAKE 1.5 TABLETS (90 MG TOTAL) BY MOUTH DAILY  -- INCREASE IN DOSAGE 10/12/20   Nahser, Wonda Cheng, MD  Lancets (FREESTYLE) lancets 1 each by Other route 2 (two) times daily. 03/23/20   [provider]  lansoprazole (PREVACID) 30 MG capsule Take 30 mg by mouth daily as needed (heartburn).  11/18/14   [provider]  levothyroxine (SYNTHROID, LEVOTHROID) 88 MCG tablet Take 88 mcg by mouth daily before breakfast.    [provider]  lisinopril (PRINIVIL,ZESTRIL) 40 MG tablet Take 1 tablet (40 mg total) by mouth daily. 09/30/18   Roxan Hockey, MD  metoprolol succinate (TOPROL-XL) 100 MG 24 hr tablet TAKE 1 TABLET (100 MG TOTAL) BY MOUTH DAILY. TAKE WITH OR IMMEDIATELY FOLLOWING A MEAL. 01/04/21   Nahser, Wonda Cheng, MD  Multiple Vitamin (MULTIVITAMIN) tablet Take 1 tablet by mouth 2 (two) times daily.     [provider]  oxymetazoline (NASAL SPRAY 12 HOUR) 0.05 % nasal spray Place 1 spray into both nostrils 2 (two) times daily as needed for congestion.    [provider]  potassium chloride (KLOR-CON) 10 MEQ tablet TAKE 1 TABLET (10 MEQ TOTAL) BY MOUTH 2 (TWO) TIMES DAILY. 03/02/21   Nahser, Wonda Cheng, MD  pravastatin (PRAVACHOL) 20 MG tablet Take 20 mg by mouth daily.    [provider]    Allergies    Amlodipine besylate and Hydrochlorothiazide  Review of Systems   Review of Systems  Constitutional: Negative for chills and fever.  HENT: Positive for sore throat. Negative for ear pain.   Eyes: Negative for pain and visual disturbance.  Respiratory: Positive for shortness of breath. Negative for cough.   Cardiovascular: Negative for chest pain and palpitations.  Gastrointestinal: Negative for abdominal pain and vomiting.  Genitourinary: Negative for dysuria and hematuria.  Musculoskeletal: Negative for arthralgias and back pain.  Skin: Negative for color change and rash.  Neurological: Negative for seizures and syncope.  All other systems reviewed and are negative.   Physical  Exam Updated Vital Signs BP (!) 156/86   Pulse 67   Temp 98.3 F (36.8 C) (Oral)   Resp (!) 21   Ht 5\' 4"  (1.626 m)   Wt 96.6 kg   SpO2 93%   BMI 36.56 kg/m   Physical Exam Vitals and nursing note reviewed.  Constitutional:      General: She is not in acute distress.    Appearance: She is well-developed.  HENT:     Head: Normocephalic and atraumatic.     Comments: Oropharynx non erythematous without exudates, uvula midline, no unilateral tonsillar swelling, tongue normal size and midline, no sublingual/submandibular  swellimg, tolerating secretions well    Eyes:     Conjunctiva/sclera: Conjunctivae normal.  Cardiovascular:     Rate and Rhythm: Normal rate and regular rhythm.     Heart sounds: No murmur heard.   Pulmonary:     Effort: Pulmonary effort is normal. No respiratory distress.     Breath sounds: Normal breath sounds. No decreased breath sounds, wheezing, rhonchi or rales.  Chest:     Chest wall: No tenderness.  Abdominal:     Palpations: Abdomen is soft.     Tenderness: There is no abdominal tenderness.  Musculoskeletal:     Cervical back: Neck supple.     Right lower leg: No edema.     Left lower leg: No edema.  Skin:    General: Skin is warm and dry.  Neurological:     General: No focal deficit present.     Mental Status: She is alert.  Psychiatric:        Mood and Affect: Mood normal.        Behavior: Behavior normal.     ED Results / Procedures / Treatments   Labs (all labs ordered are listed, but only abnormal results are displayed) Labs Reviewed  CBC WITH DIFFERENTIAL/PLATELET - Abnormal; Notable for the following components:      Result Value   nRBC 0.3 (*)    All other components within normal limits  COMPREHENSIVE METABOLIC PANEL - Abnormal; Notable for the following components:   Glucose, Bld 127 (*)    BUN 30 (*)    Creatinine, Ser 1.37 (*)    GFR, Estimated 40 (*)    All other components within normal limits  RESP PANEL BY RT-PCR  (FLU A&B, COVID) ARPGX2  BRAIN NATRIURETIC PEPTIDE  TROPONIN I (HIGH SENSITIVITY)    EKG EKG Interpretation  Date/Time:  Monday May 17 2021 17:35:49 EDT Ventricular Rate:  67 PR Interval:  196 QRS Duration: 153 QT Interval:  454 QTC Calculation: 480 R Axis:   -30 Text Interpretation: Sinus rhythm Right bundle branch block Confirmed by Lennice Sites ([id number]) on 05/17/2021 7:07:14 PM   Radiology DG Chest 2 View  Result Date: 05/17/2021 CLINICAL DATA:  Cough EXAM: CHEST - 2 VIEW COMPARISON:  September 25, 2018 FINDINGS: The cardiomediastinal silhouette is unchanged in contour.Probable calcified RIGHT hilar lymph node, unchanged. No pleural effusion. No pneumothorax. No acute pleuroparenchymal abnormality. Visualized abdomen is unremarkable. Multilevel degenerative changes of the thoracic spine. IMPRESSION: No acute cardiopulmonary abnormality. Electronically Signed   By: Valentino Saxon MD   On: 05/17/2021 18:36    Procedures Procedures   Medications Ordered in ED Medications  albuterol (VENTOLIN HFA) 108 (90 Base) MCG/ACT inhaler 2 puff (has no administration in time range)    ED Course  I have reviewed the triage vital signs and the nursing notes.  Pertinent labs & imaging results that were available during my care of the patient were reviewed by me and considered in my medical decision making (see chart for details).    MDM Rules/Calculators/A&P                          75 year old female who presents to the ER with complaints of shortness of breath.  On arrival, she is very well-appearing, no acute distress, resting comfortably in ER bed.  Vitals overall reassuring, though she is slightly hypertensive.  No fever, tachycardia.  Sats at 100%, patient ambulated without evidence of hypoxia.  No significant lower  extremity edema.  Labs and imaging ordered, reviewed and interpreted by me.  CMP with stable creatinine, COVID-negative, initial troponin of 4, normal BNP.  Chest x-ray  with out evidence of pneumonia.  CBC unremarkable.   Patient likely getting over URI.  No evidence of ACS or PE, heart failure exacerbation, pneumonia.  COVID and flu are negative.  NO evidence of anemia. No evidence of hypoxia.  Discussed over-the-counter treatments with the patient.  Discussed return precautions.  She was understanding and is agreeable.  Stable for discharge.   This was a shared visit with my supervising physician Dr. Ronnald Nian who independently saw and evaluated the patient & provided guidance in evaluation/management/disposition ,in agreement with care  Final Clinical Impression(s) / ED Diagnoses Final diagnoses:  SOB (shortness of breath)    Rx / DC Orders ED Discharge Orders    None       Garald Balding, PA-C 05/17/21 [place], [place], DO 05/17/21 2154

## 2021-05-17 NOTE — Discharge Instructions (Addendum)
You were evaluated in the Emergency Department and after careful evaluation, we did not find any emergent condition requiring admission or further testing in the hospital.  You may take Mucinex, Tylenol, or other cold and flu over-the-counter medications  Please return to the Emergency Department if you experience any worsening of your condition.  We encourage you to follow up with a primary care provider.  Thank you for allowing [place] to be a part of your care.

## 2021-05-17 NOTE — ED Triage Notes (Signed)
Pt states that she had a head cold and a cough last week and that she had new onset shortness of breath starting today. Pt denies chest pain. Pt states that her shortness of breath began this afternoon.

## 2021-06-04 DIAGNOSIS — E7800 Pure hypercholesterolemia, unspecified: Secondary | ICD-10-CM | POA: Diagnosis not present

## 2021-06-04 DIAGNOSIS — I10 Essential (primary) hypertension: Secondary | ICD-10-CM | POA: Diagnosis not present

## 2021-06-04 DIAGNOSIS — E1122 Type 2 diabetes mellitus with diabetic chronic kidney disease: Secondary | ICD-10-CM | POA: Diagnosis not present

## 2021-06-04 DIAGNOSIS — E039 Hypothyroidism, unspecified: Secondary | ICD-10-CM | POA: Diagnosis not present

## 2021-06-22 ENCOUNTER — Ambulatory Visit
Admission: RE | Admit: 2021-06-22 | Discharge: 2021-06-22 | Disposition: A | Discharge status: Home / Self Care | Payer: Medicare Other | Source: Ambulatory Visit | Attending: Obstetrics and Gynecology | Admitting: Obstetrics and Gynecology

## 2021-06-22 ENCOUNTER — Other Ambulatory Visit: Payer: Self-pay

## 2021-06-22 DIAGNOSIS — Z0000 Encounter for general adult medical examination without abnormal findings: Secondary | ICD-10-CM

## 2021-06-22 DIAGNOSIS — Z1231 Encounter for screening mammogram for malignant neoplasm of breast: Secondary | ICD-10-CM | POA: Diagnosis not present

## 2021-08-04 DIAGNOSIS — I129 Hypertensive chronic kidney disease with stage 1 through stage 4 chronic kidney disease, or unspecified chronic kidney disease: Secondary | ICD-10-CM | POA: Diagnosis not present

## 2021-08-04 DIAGNOSIS — N1831 Chronic kidney disease, stage 3a: Secondary | ICD-10-CM | POA: Diagnosis not present

## 2021-08-04 DIAGNOSIS — N2581 Secondary hyperparathyroidism of renal origin: Secondary | ICD-10-CM | POA: Diagnosis not present

## 2021-08-04 DIAGNOSIS — D631 Anemia in chronic kidney disease: Secondary | ICD-10-CM | POA: Diagnosis not present

## 2021-08-04 DIAGNOSIS — N189 Chronic kidney disease, unspecified: Secondary | ICD-10-CM | POA: Diagnosis not present

## 2021-08-04 DIAGNOSIS — I5030 Unspecified diastolic (congestive) heart failure: Secondary | ICD-10-CM | POA: Diagnosis not present

## 2021-08-27 DIAGNOSIS — E7800 Pure hypercholesterolemia, unspecified: Secondary | ICD-10-CM | POA: Diagnosis not present

## 2021-08-27 DIAGNOSIS — Z23 Encounter for immunization: Secondary | ICD-10-CM | POA: Diagnosis not present

## 2021-08-27 DIAGNOSIS — G4733 Obstructive sleep apnea (adult) (pediatric): Secondary | ICD-10-CM | POA: Diagnosis not present

## 2021-08-27 DIAGNOSIS — N183 Chronic kidney disease, stage 3 unspecified: Secondary | ICD-10-CM | POA: Diagnosis not present

## 2021-08-27 DIAGNOSIS — I10 Essential (primary) hypertension: Secondary | ICD-10-CM | POA: Diagnosis not present

## 2021-08-27 DIAGNOSIS — E1122 Type 2 diabetes mellitus with diabetic chronic kidney disease: Secondary | ICD-10-CM | POA: Diagnosis not present

## 2021-08-27 DIAGNOSIS — E2839 Other primary ovarian failure: Secondary | ICD-10-CM | POA: Diagnosis not present

## 2021-08-27 DIAGNOSIS — E039 Hypothyroidism, unspecified: Secondary | ICD-10-CM | POA: Diagnosis not present

## 2021-08-27 DIAGNOSIS — Z0000 Encounter for general adult medical examination without abnormal findings: Secondary | ICD-10-CM | POA: Diagnosis not present

## 2021-08-29 ENCOUNTER — Other Ambulatory Visit: Payer: Self-pay | Admitting: Cardiovascular Disease

## 2021-08-29 NOTE — Progress Notes (Signed)
Cardiology Office Note:    Date:  08/30/2021   ID:  CLINTONA FOULKE, DOB 1946/07/16, MRN [id number]  PCP:  Glenis Smoker, MD   [facility] Cardiologist:  Mertie Moores, MD Cardiology APP:  Sh[facility] Revere      Referring MD: Glenis Smoker, *   Chief Complaint:  No chief complaint on file.    Patient Profile:    Jody Oneill is a 75 y.o. female with:  (HFpEF) heart failure with preserved ejection fraction  OSA Diabetes mellitus  Hypertension  Chronic kidney disease  Myoview in 07/2017 low risk Echocardiogram [id number]: mod diastolic dysfunction, normal LVSF Fatty Liver Hypothyroidism    Prior CV studies:  Nuclear stress test 08/08/17 EF 65, no ischemia or scar, normal study   Echocardiogram 08/07/17 Mild concentric LVH, EF 55-60, normal wall motion, grade 2 diastolic dysfunction, moderate LAE   Echocardiogram 12/05/14 Mild focal basal septal hypertrophy, EF 55-60, normal wall motion, grade 2 diastolic dysfunction, mild LAE, PASP 36     History of Present Illness: Ms. Dupuis was last seen by Dr. Acie Fredrickson in 8/21.  She returns for f/u.  She is here alone.  She has been doing well.  She has lost 17 pounds since last year.  She has not had significant shortness of breath, orthopnea, leg edema, chest pain or syncope.  She does have to catch her breath at the top of the flight of steps.        Past Medical History:  Diagnosis Date   Chronic diastolic CHF (congestive heart failure) ([place]) 06/15/2015   Chronic kidney disease    Fatty liver    GERD (gastroesophageal reflux disease)    Gout    History of echocardiogram    Echo 8/18: mild conc LVH, EF 55-60, no RWMA, Gr 2 DD, mod LAE   History of nuclear stress test    Nuclear stress test 8/18: EF 65, no ischemia or scar, normal study   Hypertension    Hypothyroid    Obesity    fatty liver by [place]   OSA on CPAP    Osteoarthritis    Type 2 diabetes mellitus (HCC)     Current  Medications: Current Meds  Medication Sig   acetaminophen (TYLENOL) 325 MG tablet Take 650 mg by mouth every 6 (six) hours as needed for mild pain, moderate pain or headache.   allopurinol (ZYLOPRIM) 300 MG tablet Take 300 mg by mouth every other day.    clobetasol cream (TEMOVATE) [id number] % Apply 1 application topically as needed (Rash).   FREESTYLE LITE test strip 1 each by Other route daily.   furosemide (LASIX) 40 MG tablet Take 1 tablet (40 mg total) by mouth daily.   hydrALAZINE (APRESOLINE) 100 MG tablet Take 50 mg by mouth 3 (three) times daily.   isosorbide mononitrate (IMDUR) 60 MG 24 hr tablet TAKE 1.5 TABLETS (90 MG TOTAL) BY MOUTH DAILY -- INCREASE IN DOSAGE   JARDIANCE 25 MG TABS tablet Take 25 mg by mouth daily.   Lancets (FREESTYLE) lancets 1 each by Other route 2 (two) times daily.   lansoprazole (PREVACID) 30 MG capsule Take 30 mg by mouth daily as needed (heartburn).    levothyroxine (SYNTHROID, LEVOTHROID) 88 MCG tablet Take 88 mcg by mouth daily before breakfast.   lisinopril (PRINIVIL,ZESTRIL) 40 MG tablet Take 1 tablet (40 mg total) by mouth daily.   metoprolol succinate (TOPROL-XL) 100 MG 24 hr tablet TAKE 1 TABLET (100 MG TOTAL)  BY MOUTH DAILY. TAKE WITH OR IMMEDIATELY FOLLOWING A MEAL.   Multiple Vitamin (MULTIVITAMIN) tablet Take 1 tablet by mouth 2 (two) times daily.    oxymetazoline (NASAL SPRAY 12 HOUR) 0.05 % nasal spray Place 1 spray into both nostrils 2 (two) times daily as needed for congestion.   OZEMPIC, 1 MG/DOSE, 4 MG/3ML SOPN Inject 1 mg into the skin once a week.   potassium chloride (KLOR-CON) 10 MEQ tablet TAKE 1 TABLET (10 MEQ TOTAL) BY MOUTH 2 (TWO) TIMES DAILY.   pravastatin (PRAVACHOL) 20 MG tablet Take 20 mg by mouth daily.     Allergies:   Amlodipine besylate and Hydrochlorothiazide   Social History   Tobacco Use   Smoking status: Never   Smokeless tobacco: Never  Vaping Use   Vaping Use: Never used  Substance Use Topics   Alcohol use: No     Alcohol/week: 0.0 standard drinks   Drug use: No     Family Hx: The patient's family history includes Breast cancer in her maternal aunt and maternal aunt; Diabetes in her father; Lung cancer in her mother; Other in her father.  Review of Systems  Gastrointestinal:  Negative for hematochezia and melena.  Genitourinary:  Negative for hematuria.    EKGs/Labs/Other Test Reviewed:    EKG:  EKG is not ordered today.  The ekg ordered today demonstrates n/a  Recent Labs: 05/17/2021: ALT 18; B Natriuretic Peptide 53.5; BUN 30; Creatinine, Ser 1.37; Hemoglobin 12.0; Platelets 224; Potassium 4.6; Sodium 136   Recent Lipid Panel No results found for: CHOL, TRIG, HDL, LDLCALC, LDLDIRECT  Labs obtained through [facility] - personally reviewed and interpreted: 08/27/2021: Total cholesterol 155, HDL 53, LDL 83, triglycerides 106, A1c 6.6, creatinine 1.53, K+ on 3.8, ALT 15, TSH 1.36   Risk Assessment/Calculations:          Physical Exam:    VS:  BP 112/60   Pulse 74   Ht '5\' 3"'$  (1.6 m)   Wt 209 lb 6.4 oz (95 kg)   SpO2 97%   BMI 37.09 kg/m     Wt Readings from Last 3 Encounters:  08/30/21 209 lb 6.4 oz (95 kg)  05/17/21 213 lb (96.6 kg)  07/31/20 226 lb 6.4 oz (102.7 kg)     Constitutional:      Appearance: Healthy appearance. Not in distress.  Neck:     Vascular: No carotid bruit or JVR. JVD normal.  Pulmonary:     Effort: Pulmonary effort is normal.     Breath sounds: No wheezing. No rales.  Cardiovascular:     Normal rate. Regular rhythm. Normal S1. Normal S2.      Murmurs: There is no murmur.  Pulses:    Intact distal pulses.  Edema:    Peripheral edema present.    Ankle: bilateral trace edema of the ankle. Abdominal:     Palpations: Abdomen is soft.  Skin:    General: Skin is warm and dry.  Neurological:     Mental Status: Alert and oriented to person, place and time.     Cranial Nerves: Cranial nerves are intact.       ASSESSMENT & PLAN:    1. Chronic heart  failure with preserved ejection fraction (HCC) NYHA II-IIb.  Volume status currently stable.  EF 55-60 by echo in 8/18.  She remains on furosemide, empagliflozin.  She has lost a significant amount of weight since her last visit.  This is likely related to semaglutide.  Follow-up in 1  year.  2. Stage 3a chronic kidney disease ([place]) Recent creatinine stable.  3. Essential hypertension Blood pressure is well controlled.  I explained to her that if she continues to lose a significant amount of weight, we may need to reduce or discontinue clinical blood pressure medications.  She knows to contact us if her blood pressure runs low or she is symptomatic   Dispo:  Return in about 1 year (around 08/30/2022) for Routine follow up in 1 year with Dr. Johnsie Cancel or Versie Starks. .   Medication Adjustments/Labs and Tests Ordered: Current medicines are reviewed at length with the patient today.  Concerns regarding medicines are outlined above.  Tests Ordered: No orders of the defined types were placed in this encounter.  Medication Changes: No orders of the defined types were placed in this encounter.   Signed, Richardson Dopp, PA-C  08/30/2021 10:26 AM    [facility] [address], [place], [place]  [address] Phone: [phone]; Fax: [phone]

## 2021-08-30 ENCOUNTER — Encounter: Payer: Self-pay | Admitting: Physician Assistant

## 2021-08-30 ENCOUNTER — Ambulatory Visit (INDEPENDENT_AMBULATORY_CARE_PROVIDER_SITE_OTHER): Payer: Medicare Other | Admitting: Physician Assistant

## 2021-08-30 ENCOUNTER — Other Ambulatory Visit: Payer: Self-pay

## 2021-08-30 VITALS — BP 112/60 | HR 74 | Ht 63.0 in | Wt 209.4 lb

## 2021-08-30 DIAGNOSIS — N1831 Chronic kidney disease, stage 3a: Secondary | ICD-10-CM | POA: Diagnosis not present

## 2021-08-30 DIAGNOSIS — I5032 Chronic diastolic (congestive) heart failure: Secondary | ICD-10-CM | POA: Diagnosis not present

## 2021-08-30 DIAGNOSIS — I10 Essential (primary) hypertension: Secondary | ICD-10-CM

## 2021-08-30 NOTE — Patient Instructions (Addendum)
Medication Instructions:   Your physician recommends that you continue on your current medications as directed. Please refer to the Current Medication list given to you today.  *If you need a refill on your cardiac medications before your next appointment, please call your pharmacy*  Lab Work:  -NONE  If you have labs (blood work) drawn today and your tests are completely normal, you will receive your results only by: [place] (if you have MyChart) OR A paper copy in the mail If you have any lab test that is abnormal or we need to change your treatment, we will call you to review the results.  Testing/Procedures:  -NONE  Follow-Up: At [facility], you and your health needs are our priority.  As part of our continuing mission to provide you with exceptional heart care, we have created designated Provider Care Teams.  These Care Teams include your primary Cardiologist (physician) and Advanced Practice Providers (APPs -  Physician Assistants and Nurse Practitioners) who all work together to provide you with the care you need, when you need it.  We recommend signing up for the patient portal called "MyChart".  Sign up information is provided on this After Visit Summary.  MyChart is used to connect with patients for Virtual Visits (Telemedicine).  Patients are able to view lab/test results, encounter notes, upcoming appointments, etc.  Non-urgent messages can be sent to your provider as well.   To learn more about what you can do with MyChart, go to [email or web address].    Your next appointment:   1 year(s)  The format for your next appointment:   In Person  Provider:   Dr. Acie Fredrickson or Richardson Dopp, PA-C.   Other Instructions Your physician wants you to follow-up in: 1 year with Dr.Nishan or Richardson Dopp, PA-C.  You will receive a reminder letter in the mail two months in advance. If you don't receive a letter, please call our office to schedule the follow-up appointment.

## 2021-08-31 ENCOUNTER — Other Ambulatory Visit: Payer: Self-pay | Admitting: Family Medicine

## 2021-08-31 DIAGNOSIS — E2839 Other primary ovarian failure: Secondary | ICD-10-CM

## 2021-09-12 DIAGNOSIS — Z20822 Contact with and (suspected) exposure to covid-19: Secondary | ICD-10-CM | POA: Diagnosis not present

## 2021-09-22 ENCOUNTER — Other Ambulatory Visit: Payer: Self-pay | Admitting: Cardiovascular Disease

## 2021-10-06 ENCOUNTER — Other Ambulatory Visit: Payer: Self-pay | Admitting: Cardiovascular Disease

## 2021-10-06 DIAGNOSIS — I10 Essential (primary) hypertension: Secondary | ICD-10-CM

## 2021-11-17 DIAGNOSIS — Z23 Encounter for immunization: Secondary | ICD-10-CM | POA: Diagnosis not present

## 2021-12-08 DIAGNOSIS — Z20822 Contact with and (suspected) exposure to covid-19: Secondary | ICD-10-CM | POA: Diagnosis not present

## 2022-01-03 DIAGNOSIS — E7800 Pure hypercholesterolemia, unspecified: Secondary | ICD-10-CM | POA: Diagnosis not present

## 2022-01-03 DIAGNOSIS — N183 Chronic kidney disease, stage 3 unspecified: Secondary | ICD-10-CM | POA: Diagnosis not present

## 2022-01-03 DIAGNOSIS — E1122 Type 2 diabetes mellitus with diabetic chronic kidney disease: Secondary | ICD-10-CM | POA: Diagnosis not present

## 2022-01-03 DIAGNOSIS — M1990 Unspecified osteoarthritis, unspecified site: Secondary | ICD-10-CM | POA: Diagnosis not present

## 2022-01-03 DIAGNOSIS — K219 Gastro-esophageal reflux disease without esophagitis: Secondary | ICD-10-CM | POA: Diagnosis not present

## 2022-01-03 DIAGNOSIS — E039 Hypothyroidism, unspecified: Secondary | ICD-10-CM | POA: Diagnosis not present

## 2022-01-03 DIAGNOSIS — I10 Essential (primary) hypertension: Secondary | ICD-10-CM | POA: Diagnosis not present

## 2022-01-03 DIAGNOSIS — I5032 Chronic diastolic (congestive) heart failure: Secondary | ICD-10-CM | POA: Diagnosis not present

## 2022-01-14 DIAGNOSIS — Z20822 Contact with and (suspected) exposure to covid-19: Secondary | ICD-10-CM | POA: Diagnosis not present

## 2022-01-18 DIAGNOSIS — N1831 Chronic kidney disease, stage 3a: Secondary | ICD-10-CM | POA: Diagnosis not present

## 2022-01-18 DIAGNOSIS — N189 Chronic kidney disease, unspecified: Secondary | ICD-10-CM | POA: Diagnosis not present

## 2022-01-26 DIAGNOSIS — N2581 Secondary hyperparathyroidism of renal origin: Secondary | ICD-10-CM | POA: Diagnosis not present

## 2022-01-26 DIAGNOSIS — I129 Hypertensive chronic kidney disease with stage 1 through stage 4 chronic kidney disease, or unspecified chronic kidney disease: Secondary | ICD-10-CM | POA: Diagnosis not present

## 2022-01-26 DIAGNOSIS — D631 Anemia in chronic kidney disease: Secondary | ICD-10-CM | POA: Diagnosis not present

## 2022-01-26 DIAGNOSIS — I5030 Unspecified diastolic (congestive) heart failure: Secondary | ICD-10-CM | POA: Diagnosis not present

## 2022-01-26 DIAGNOSIS — N1832 Chronic kidney disease, stage 3b: Secondary | ICD-10-CM | POA: Diagnosis not present

## 2022-01-28 DIAGNOSIS — Z20822 Contact with and (suspected) exposure to covid-19: Secondary | ICD-10-CM | POA: Diagnosis not present

## 2022-02-14 DIAGNOSIS — Z20822 Contact with and (suspected) exposure to covid-19: Secondary | ICD-10-CM | POA: Diagnosis not present

## 2022-02-15 ENCOUNTER — Ambulatory Visit
Admission: RE | Admit: 2022-02-15 | Discharge: 2022-02-15 | Disposition: A | Discharge status: Home / Self Care | Payer: Medicare Other | Source: Ambulatory Visit | Attending: Family Medicine | Admitting: Family Medicine

## 2022-02-15 DIAGNOSIS — E2839 Other primary ovarian failure: Secondary | ICD-10-CM

## 2022-02-15 DIAGNOSIS — Z780 Asymptomatic menopausal state: Secondary | ICD-10-CM | POA: Diagnosis not present

## 2022-02-15 DIAGNOSIS — M85852 Other specified disorders of bone density and structure, left thigh: Secondary | ICD-10-CM | POA: Diagnosis not present

## 2022-02-23 DIAGNOSIS — I10 Essential (primary) hypertension: Secondary | ICD-10-CM | POA: Diagnosis not present

## 2022-02-23 DIAGNOSIS — E1122 Type 2 diabetes mellitus with diabetic chronic kidney disease: Secondary | ICD-10-CM | POA: Diagnosis not present

## 2022-02-23 DIAGNOSIS — G4733 Obstructive sleep apnea (adult) (pediatric): Secondary | ICD-10-CM | POA: Diagnosis not present

## 2022-02-24 DIAGNOSIS — E7800 Pure hypercholesterolemia, unspecified: Secondary | ICD-10-CM | POA: Diagnosis not present

## 2022-02-24 DIAGNOSIS — I10 Essential (primary) hypertension: Secondary | ICD-10-CM | POA: Diagnosis not present

## 2022-02-24 DIAGNOSIS — E039 Hypothyroidism, unspecified: Secondary | ICD-10-CM | POA: Diagnosis not present

## 2022-02-24 DIAGNOSIS — E1122 Type 2 diabetes mellitus with diabetic chronic kidney disease: Secondary | ICD-10-CM | POA: Diagnosis not present

## 2022-02-24 DIAGNOSIS — R200 Anesthesia of skin: Secondary | ICD-10-CM | POA: Diagnosis not present

## 2022-03-16 DIAGNOSIS — Z20822 Contact with and (suspected) exposure to covid-19: Secondary | ICD-10-CM | POA: Diagnosis not present

## 2022-03-17 DIAGNOSIS — Z20822 Contact with and (suspected) exposure to covid-19: Secondary | ICD-10-CM | POA: Diagnosis not present

## 2022-03-30 DIAGNOSIS — Z20822 Contact with and (suspected) exposure to covid-19: Secondary | ICD-10-CM | POA: Diagnosis not present

## 2022-04-14 DIAGNOSIS — K219 Gastro-esophageal reflux disease without esophagitis: Secondary | ICD-10-CM | POA: Diagnosis not present

## 2022-04-14 DIAGNOSIS — E7800 Pure hypercholesterolemia, unspecified: Secondary | ICD-10-CM | POA: Diagnosis not present

## 2022-04-14 DIAGNOSIS — E039 Hypothyroidism, unspecified: Secondary | ICD-10-CM | POA: Diagnosis not present

## 2022-04-14 DIAGNOSIS — N183 Chronic kidney disease, stage 3 unspecified: Secondary | ICD-10-CM | POA: Diagnosis not present

## 2022-04-14 DIAGNOSIS — I10 Essential (primary) hypertension: Secondary | ICD-10-CM | POA: Diagnosis not present

## 2022-04-14 DIAGNOSIS — E1122 Type 2 diabetes mellitus with diabetic chronic kidney disease: Secondary | ICD-10-CM | POA: Diagnosis not present

## 2022-04-14 DIAGNOSIS — I5032 Chronic diastolic (congestive) heart failure: Secondary | ICD-10-CM | POA: Diagnosis not present

## 2022-04-18 DIAGNOSIS — Z20822 Contact with and (suspected) exposure to covid-19: Secondary | ICD-10-CM | POA: Diagnosis not present

## 2022-04-21 DIAGNOSIS — Z20822 Contact with and (suspected) exposure to covid-19: Secondary | ICD-10-CM | POA: Diagnosis not present

## 2022-05-03 DIAGNOSIS — H18413 Arcus senilis, bilateral: Secondary | ICD-10-CM | POA: Diagnosis not present

## 2022-05-03 DIAGNOSIS — H353132 Nonexudative age-related macular degeneration, bilateral, intermediate dry stage: Secondary | ICD-10-CM | POA: Diagnosis not present

## 2022-05-03 DIAGNOSIS — H5203 Hypermetropia, bilateral: Secondary | ICD-10-CM | POA: Diagnosis not present

## 2022-05-03 DIAGNOSIS — H35033 Hypertensive retinopathy, bilateral: Secondary | ICD-10-CM | POA: Diagnosis not present

## 2022-05-12 ENCOUNTER — Other Ambulatory Visit: Payer: Self-pay | Admitting: Family Medicine

## 2022-05-12 DIAGNOSIS — Z1231 Encounter for screening mammogram for malignant neoplasm of breast: Secondary | ICD-10-CM

## 2022-05-20 DIAGNOSIS — Z0142 Encounter for cervical smear to confirm findings of recent normal smear following initial abnormal smear: Secondary | ICD-10-CM | POA: Diagnosis not present

## 2022-05-20 DIAGNOSIS — M8580 Other specified disorders of bone density and structure, unspecified site: Secondary | ICD-10-CM | POA: Diagnosis not present

## 2022-05-20 DIAGNOSIS — Z6836 Body mass index (BMI) 36.0-36.9, adult: Secondary | ICD-10-CM | POA: Diagnosis not present

## 2022-05-20 DIAGNOSIS — Z01411 Encounter for gynecological examination (general) (routine) with abnormal findings: Secondary | ICD-10-CM | POA: Diagnosis not present

## 2022-05-20 DIAGNOSIS — Z124 Encounter for screening for malignant neoplasm of cervix: Secondary | ICD-10-CM | POA: Diagnosis not present

## 2022-05-20 DIAGNOSIS — Z01419 Encounter for gynecological examination (general) (routine) without abnormal findings: Secondary | ICD-10-CM | POA: Diagnosis not present

## 2022-06-10 DIAGNOSIS — K219 Gastro-esophageal reflux disease without esophagitis: Secondary | ICD-10-CM | POA: Diagnosis not present

## 2022-06-10 DIAGNOSIS — I10 Essential (primary) hypertension: Secondary | ICD-10-CM | POA: Diagnosis not present

## 2022-06-10 DIAGNOSIS — E039 Hypothyroidism, unspecified: Secondary | ICD-10-CM | POA: Diagnosis not present

## 2022-06-10 DIAGNOSIS — N183 Chronic kidney disease, stage 3 unspecified: Secondary | ICD-10-CM | POA: Diagnosis not present

## 2022-06-10 DIAGNOSIS — I5032 Chronic diastolic (congestive) heart failure: Secondary | ICD-10-CM | POA: Diagnosis not present

## 2022-06-10 DIAGNOSIS — E7800 Pure hypercholesterolemia, unspecified: Secondary | ICD-10-CM | POA: Diagnosis not present

## 2022-06-10 DIAGNOSIS — E1122 Type 2 diabetes mellitus with diabetic chronic kidney disease: Secondary | ICD-10-CM | POA: Diagnosis not present

## 2022-06-23 ENCOUNTER — Ambulatory Visit
Admission: RE | Admit: 2022-06-23 | Discharge: 2022-06-23 | Disposition: A | Discharge status: Home / Self Care | Payer: Medicare Other | Source: Ambulatory Visit | Attending: Family Medicine | Admitting: Family Medicine

## 2022-06-23 DIAGNOSIS — Z1231 Encounter for screening mammogram for malignant neoplasm of breast: Secondary | ICD-10-CM

## 2022-07-07 ENCOUNTER — Other Ambulatory Visit: Payer: Self-pay | Admitting: Cardiovascular Disease

## 2022-07-07 DIAGNOSIS — I10 Essential (primary) hypertension: Secondary | ICD-10-CM

## 2022-07-21 ENCOUNTER — Encounter (HOSPITAL_BASED_OUTPATIENT_CLINIC_OR_DEPARTMENT_OTHER): Payer: Self-pay | Admitting: Emergency Medicine

## 2022-07-21 ENCOUNTER — Emergency Department (HOSPITAL_BASED_OUTPATIENT_CLINIC_OR_DEPARTMENT_OTHER): Payer: Medicare Other | Admitting: Radiology

## 2022-07-21 ENCOUNTER — Other Ambulatory Visit: Payer: Self-pay

## 2022-07-21 DIAGNOSIS — W109XXA Fall (on) (from) unspecified stairs and steps, initial encounter: Secondary | ICD-10-CM | POA: Insufficient documentation

## 2022-07-21 DIAGNOSIS — S42211A Unspecified displaced fracture of surgical neck of right humerus, initial encounter for closed fracture: Secondary | ICD-10-CM | POA: Insufficient documentation

## 2022-07-21 DIAGNOSIS — I5030 Unspecified diastolic (congestive) heart failure: Secondary | ICD-10-CM | POA: Diagnosis not present

## 2022-07-21 DIAGNOSIS — N189 Chronic kidney disease, unspecified: Secondary | ICD-10-CM | POA: Insufficient documentation

## 2022-07-21 DIAGNOSIS — Z79899 Other long term (current) drug therapy: Secondary | ICD-10-CM | POA: Insufficient documentation

## 2022-07-21 DIAGNOSIS — W19XXXA Unspecified fall, initial encounter: Secondary | ICD-10-CM | POA: Diagnosis not present

## 2022-07-21 DIAGNOSIS — I130 Hypertensive heart and chronic kidney disease with heart failure and stage 1 through stage 4 chronic kidney disease, or unspecified chronic kidney disease: Secondary | ICD-10-CM | POA: Diagnosis not present

## 2022-07-21 DIAGNOSIS — Y92009 Unspecified place in unspecified non-institutional (private) residence as the place of occurrence of the external cause: Secondary | ICD-10-CM | POA: Diagnosis not present

## 2022-07-21 DIAGNOSIS — E1122 Type 2 diabetes mellitus with diabetic chronic kidney disease: Secondary | ICD-10-CM | POA: Insufficient documentation

## 2022-07-21 DIAGNOSIS — S4991XA Unspecified injury of right shoulder and upper arm, initial encounter: Secondary | ICD-10-CM | POA: Diagnosis present

## 2022-07-21 DIAGNOSIS — M25511 Pain in right shoulder: Secondary | ICD-10-CM | POA: Diagnosis not present

## 2022-07-21 DIAGNOSIS — M25519 Pain in unspecified shoulder: Secondary | ICD-10-CM | POA: Diagnosis not present

## 2022-07-21 NOTE — ED Triage Notes (Addendum)
Pt BIB EMS from home after falling going up the stairs. Pt did not hit her head, not on thinners. Pt c/o right shoulder pain that goes to the wrist. Pt does not have a deformity or swelling on shoulder, swelling to right wrist.

## 2022-07-21 NOTE — ED Triage Notes (Signed)
[address] going up stairs, right arm injury/pain right shoulder and some tenderness in neck. Denies hitting head, no loc. Denies any other pain/injury. Fall happened around 1930. Took tylenol with some relief. Pain decrease from a 10/10 to a 9/10.

## 2022-07-22 ENCOUNTER — Emergency Department (HOSPITAL_BASED_OUTPATIENT_CLINIC_OR_DEPARTMENT_OTHER)
Admission: EM | Admit: 2022-07-22 | Discharge: 2022-07-22 | Disposition: A | Discharge status: Home / Self Care | Payer: Medicare Other | Attending: Emergency Medicine | Admitting: Emergency Medicine

## 2022-07-22 DIAGNOSIS — W108XXA Fall (on) (from) other stairs and steps, initial encounter: Secondary | ICD-10-CM

## 2022-07-22 DIAGNOSIS — S42291A Other displaced fracture of upper end of right humerus, initial encounter for closed fracture: Secondary | ICD-10-CM

## 2022-07-22 DIAGNOSIS — S42211A Unspecified displaced fracture of surgical neck of right humerus, initial encounter for closed fracture: Secondary | ICD-10-CM | POA: Diagnosis not present

## 2022-07-22 MED ORDER — OXYCODONE HCL 5 MG PO TABS: 5.0000 mg | ORAL_TABLET | ORAL | 0 refills | Status: DC | PRN

## 2022-07-22 MED ORDER — HYDROCODONE-ACETAMINOPHEN 5-325 MG PO TABS
1.0000 | ORAL_TABLET | Freq: Once | ORAL | Status: AC
  Administered 2022-07-22: 1 via ORAL
  Filled 2022-07-22: qty 1

## 2022-07-22 NOTE — Discharge Instructions (Addendum)
Apply ice for 30 minutes at a time, 4 times a day.  You may take ibuprofen or naproxen as needed for pain.  If you need additional pain relief, add acetaminophen.  When you combine acetaminophen with either ibuprofen or naproxen, you get better pain relief and you get from taking other medication by itself.  If you still need additional pain relief, you may take oxycodone.  Be aware that oxycodone can make you drowsy, and make you more prone to falling.

## 2022-07-22 NOTE — ED Provider Notes (Signed)
[facility] EMERGENCY DEPT Provider Note   CSN: [id number] Arrival date & time: 07/21/22  2221     History  Chief Complaint  Patient presents with   Lytle Michaels    Jody Oneill is a 76 y.o. female.  The history is provided by the patient.  Fall  She has history of hypertension, diabetes, diastolic heart failure, chronic kidney disease and comes in after falling at home.  She was going up steps when she tripped and fell landing on her right arm.  She is complaining of pain in her right shoulder.  She denies head injury or loss of consciousness.  She is not on any anticoagulants or antiplatelet agents.   Home Medications Prior to Admission medications   Medication Sig Start Date End Date Taking? Authorizing Provider  oxyCODONE (ROXICODONE) 5 MG immediate release tablet Take 1 tablet (5 mg total) by mouth every 4 (four) hours as needed for severe pain. 08/25/27  Yes Delora Fuel, MD  acetaminophen (TYLENOL) 325 MG tablet Take 650 mg by mouth every 6 (six) hours as needed for mild pain, moderate pain or headache.    [provider]  allopurinol (ZYLOPRIM) 300 MG tablet Take 300 mg by mouth every other day.     [provider]  clobetasol cream (TEMOVATE) 3.66 % Apply 1 application topically as needed (Rash).    [provider]  FREESTYLE LITE test strip 1 each by Other route daily. 03/23/20   [provider]  furosemide (LASIX) 40 MG tablet TAKE 1 TABLET BY MOUTH EVERY DAY 09/22/21   Nahser, Wonda Cheng, MD  hydrALAZINE (APRESOLINE) 100 MG tablet Take 50 mg by mouth 3 (three) times daily. 06/24/21   [provider]  isosorbide mononitrate (IMDUR) 60 MG 24 hr tablet TAKE 1 AND 1/2 TABLETS BY MOUTH DAILY 10/06/21   Josue Hector, MD  JARDIANCE 25 MG TABS tablet Take 25 mg by mouth daily. 08/27/21   [provider]  Lancets (FREESTYLE) lancets 1 each by Other route 2 (two) times daily. 03/23/20   [provider]  lansoprazole  (PREVACID) 30 MG capsule Take 30 mg by mouth daily as needed (heartburn).  11/18/14   [provider]  levothyroxine (SYNTHROID, LEVOTHROID) 88 MCG tablet Take 88 mcg by mouth daily before breakfast.    [provider]  lisinopril (PRINIVIL,ZESTRIL) 40 MG tablet Take 1 tablet (40 mg total) by mouth daily. 09/30/18   Roxan Hockey, MD  metoprolol succinate (TOPROL-XL) 100 MG 24 hr tablet TAKE 1 TABLET BY MOUTH EVERY DAY WITH OR IMMEDIATELY FOLLOWING A MEAL 07/08/22   Josue Hector, MD  Multiple Vitamin (MULTIVITAMIN) tablet Take 1 tablet by mouth 2 (two) times daily.     [provider]  oxymetazoline (NASAL SPRAY 12 HOUR) 0.05 % nasal spray Place 1 spray into both nostrils 2 (two) times daily as needed for congestion.    [provider]  OZEMPIC, 1 MG/DOSE, 4 MG/3ML SOPN Inject 1 mg into the skin once a week. 06/11/21   [provider]  potassium chloride (KLOR-CON) 10 MEQ tablet TAKE 1 TABLET BY MOUTH 2 TIMES DAILY. 08/30/21   Nahser, Wonda Cheng, MD  pravastatin (PRAVACHOL) 20 MG tablet Take 20 mg by mouth daily.    [provider]      Allergies    Amlodipine besylate and Hydrochlorothiazide    Review of Systems   Review of Systems  All other systems reviewed and are negative.   Physical Exam  Updated Vital Signs BP (!) 155/77 (BP Location: Left Arm)   Pulse 72   Temp 97.9 F (36.6 C)   Resp 18   SpO2 100%  Physical Exam Vitals and nursing note reviewed.   76 year old female, resting comfortably and in no acute distress. Vital signs are significant for elevated blood pressure. Oxygen saturation is 100%, which is normal. Head is normocephalic and atraumatic. PERRLA, EOMI. Oropharynx is clear. Neck is nontender and supple without adenopathy or JVD. Back is nontender and there is no CVA tenderness. Lungs are clear without rales, wheezes, or rhonchi. Chest is nontender. Heart has regular rate and rhythm without murmur. Abdomen is  soft, flat, nontender. Extremities: There is tenderness to palpation and slight soft tissue swelling around the proximal right humerus.  There is pain with any passive range of motion of the right shoulder.  There is no tenderness over the right clavicle, right elbow.  Full range of motion is present in the right elbow and wrist.  Full range of motion present all other joints without pain. Skin is warm and dry without rash. Neurologic: Mental status is normal, cranial nerves are intact, moves all extremities equally except for her right arm.  ED Results / Procedures / Treatments    Radiology DG Shoulder Right  Result Date: 07/21/2022 CLINICAL DATA:  Recent fall with right shoulder pain, initial encounter EXAM: RIGHT SHOULDER - 2+ VIEW COMPARISON:  None Available. FINDINGS: Fracture is noted in the proximal right humerus involving primarily the surgical neck. Mild impaction is noted at the fracture site. No dislocation is seen. Underlying bony thorax is within normal limits. Mild degenerative changes of the acromioclavicular joint are seen. IMPRESSION: Proximal right humeral fracture as described. Electronically Signed   By: Inez Catalina M.D.   On: 07/21/2022 23:25    Procedures .Ortho Injury Treatment  Date/Time: 07/22/2022 2:55 AM  Performed by: Delora Fuel, MD Authorized by: Delora Fuel, MD   Consent:    Consent obtained:  Verbal   Consent given by:  Patient   Risks discussed:  Restricted joint movement   Alternatives discussed:  No treatmentInjury location: shoulder Location details: right shoulder Injury type: fracture Fracture type: surgical neck Pre-procedure neurovascular assessment: neurovascularly intact Pre-procedure distal perfusion: normal Pre-procedure neurological function: normal Pre-procedure range of motion: reduced  Anesthesia: Local anesthesia used: no  Patient sedated: NoManipulation performed: no Immobilization: sling Splint Applied by: ED Nurse Supplies  used: sling. Post-procedure distal perfusion: normal Post-procedure neurological function: normal Post-procedure range of motion: unchanged       Medications Ordered in ED Medications  HYDROcodone-acetaminophen (NORCO/VICODIN) 5-325 MG per tablet 1 tablet (1 tablet Oral Given 07/22/22 0244)    ED Course/ Medical Decision Making/ A&P                           Medical Decision Making Amount and/or Complexity of Data Reviewed Radiology: ordered.  Risk Prescription drug management.   Fall with injury to right shoulder concerning for humerus fracture.  X-rays are obtained and show fracture of the surgical neck of the right humerus.  I have independently viewed the images, and agree with the radiologist's interpretation.  She is placed in a sling for comfort and discharged with instructions on applying ice, told to use over-the-counter NSAIDs and acetaminophen as needed for pain, given prescription for oxycodone for breakthrough pain.  She is referred to orthopedics for follow-up.  Final Clinical Impression(s) / ED Diagnoses Final diagnoses:  Fall on steps, initial encounter  Closed fracture of anatomical neck of right humerus, initial encounter    Rx / DC Orders ED Discharge Orders          Ordered    oxyCODONE (ROXICODONE) 5 MG immediate release tablet  Every 4 hours PRN        07/22/22 1658              Delora Fuel, MD 00/63/49 [phone]

## 2022-07-25 DIAGNOSIS — N1832 Chronic kidney disease, stage 3b: Secondary | ICD-10-CM | POA: Diagnosis not present

## 2022-07-25 DIAGNOSIS — R799 Abnormal finding of blood chemistry, unspecified: Secondary | ICD-10-CM | POA: Diagnosis not present

## 2022-07-25 DIAGNOSIS — S42294A Other nondisplaced fracture of upper end of right humerus, initial encounter for closed fracture: Secondary | ICD-10-CM | POA: Diagnosis not present

## 2022-08-01 DIAGNOSIS — S42294D Other nondisplaced fracture of upper end of right humerus, subsequent encounter for fracture with routine healing: Secondary | ICD-10-CM | POA: Diagnosis not present

## 2022-08-04 DIAGNOSIS — E1122 Type 2 diabetes mellitus with diabetic chronic kidney disease: Secondary | ICD-10-CM | POA: Diagnosis not present

## 2022-08-04 DIAGNOSIS — I5030 Unspecified diastolic (congestive) heart failure: Secondary | ICD-10-CM | POA: Diagnosis not present

## 2022-08-04 DIAGNOSIS — N2581 Secondary hyperparathyroidism of renal origin: Secondary | ICD-10-CM | POA: Diagnosis not present

## 2022-08-04 DIAGNOSIS — D631 Anemia in chronic kidney disease: Secondary | ICD-10-CM | POA: Diagnosis not present

## 2022-08-04 DIAGNOSIS — N1832 Chronic kidney disease, stage 3b: Secondary | ICD-10-CM | POA: Diagnosis not present

## 2022-08-04 DIAGNOSIS — I129 Hypertensive chronic kidney disease with stage 1 through stage 4 chronic kidney disease, or unspecified chronic kidney disease: Secondary | ICD-10-CM | POA: Diagnosis not present

## 2022-08-08 DIAGNOSIS — S42294D Other nondisplaced fracture of upper end of right humerus, subsequent encounter for fracture with routine healing: Secondary | ICD-10-CM | POA: Diagnosis not present

## 2022-08-16 DIAGNOSIS — E1122 Type 2 diabetes mellitus with diabetic chronic kidney disease: Secondary | ICD-10-CM | POA: Diagnosis not present

## 2022-08-16 DIAGNOSIS — I10 Essential (primary) hypertension: Secondary | ICD-10-CM | POA: Diagnosis not present

## 2022-08-16 DIAGNOSIS — I5032 Chronic diastolic (congestive) heart failure: Secondary | ICD-10-CM | POA: Diagnosis not present

## 2022-08-16 DIAGNOSIS — M8580 Other specified disorders of bone density and structure, unspecified site: Secondary | ICD-10-CM | POA: Diagnosis not present

## 2022-08-16 DIAGNOSIS — E039 Hypothyroidism, unspecified: Secondary | ICD-10-CM | POA: Diagnosis not present

## 2022-08-16 DIAGNOSIS — E7800 Pure hypercholesterolemia, unspecified: Secondary | ICD-10-CM | POA: Diagnosis not present

## 2022-08-16 DIAGNOSIS — K219 Gastro-esophageal reflux disease without esophagitis: Secondary | ICD-10-CM | POA: Diagnosis not present

## 2022-08-16 DIAGNOSIS — N183 Chronic kidney disease, stage 3 unspecified: Secondary | ICD-10-CM | POA: Diagnosis not present

## 2022-08-18 ENCOUNTER — Other Ambulatory Visit: Payer: Self-pay | Admitting: Cardiovascular Disease

## 2022-08-27 ENCOUNTER — Other Ambulatory Visit: Payer: Self-pay | Admitting: Cardiovascular Disease

## 2022-08-29 DIAGNOSIS — S42294D Other nondisplaced fracture of upper end of right humerus, subsequent encounter for fracture with routine healing: Secondary | ICD-10-CM | POA: Diagnosis not present

## 2022-09-01 ENCOUNTER — Ambulatory Visit: Payer: Medicare Other | Admitting: Cardiovascular Disease

## 2022-09-06 DIAGNOSIS — I130 Hypertensive heart and chronic kidney disease with heart failure and stage 1 through stage 4 chronic kidney disease, or unspecified chronic kidney disease: Secondary | ICD-10-CM | POA: Diagnosis not present

## 2022-09-06 DIAGNOSIS — E7800 Pure hypercholesterolemia, unspecified: Secondary | ICD-10-CM | POA: Diagnosis not present

## 2022-09-06 DIAGNOSIS — D649 Anemia, unspecified: Secondary | ICD-10-CM | POA: Diagnosis not present

## 2022-09-06 DIAGNOSIS — E039 Hypothyroidism, unspecified: Secondary | ICD-10-CM | POA: Diagnosis not present

## 2022-09-06 DIAGNOSIS — Z23 Encounter for immunization: Secondary | ICD-10-CM | POA: Diagnosis not present

## 2022-09-06 DIAGNOSIS — E2839 Other primary ovarian failure: Secondary | ICD-10-CM | POA: Diagnosis not present

## 2022-09-06 DIAGNOSIS — Z0000 Encounter for general adult medical examination without abnormal findings: Secondary | ICD-10-CM | POA: Diagnosis not present

## 2022-09-06 DIAGNOSIS — E1122 Type 2 diabetes mellitus with diabetic chronic kidney disease: Secondary | ICD-10-CM | POA: Diagnosis not present

## 2022-09-06 DIAGNOSIS — G4733 Obstructive sleep apnea (adult) (pediatric): Secondary | ICD-10-CM | POA: Diagnosis not present

## 2022-09-06 DIAGNOSIS — N183 Chronic kidney disease, stage 3 unspecified: Secondary | ICD-10-CM | POA: Diagnosis not present

## 2022-09-06 DIAGNOSIS — I10 Essential (primary) hypertension: Secondary | ICD-10-CM | POA: Diagnosis not present

## 2022-09-07 DIAGNOSIS — E1122 Type 2 diabetes mellitus with diabetic chronic kidney disease: Secondary | ICD-10-CM | POA: Diagnosis not present

## 2022-09-08 DIAGNOSIS — S42201D Unspecified fracture of upper end of right humerus, subsequent encounter for fracture with routine healing: Secondary | ICD-10-CM | POA: Diagnosis not present

## 2022-09-08 DIAGNOSIS — M6281 Muscle weakness (generalized): Secondary | ICD-10-CM | POA: Diagnosis not present

## 2022-09-12 DIAGNOSIS — E1122 Type 2 diabetes mellitus with diabetic chronic kidney disease: Secondary | ICD-10-CM | POA: Diagnosis not present

## 2022-09-12 DIAGNOSIS — N183 Chronic kidney disease, stage 3 unspecified: Secondary | ICD-10-CM | POA: Diagnosis not present

## 2022-09-12 DIAGNOSIS — E7800 Pure hypercholesterolemia, unspecified: Secondary | ICD-10-CM | POA: Diagnosis not present

## 2022-09-12 DIAGNOSIS — I10 Essential (primary) hypertension: Secondary | ICD-10-CM | POA: Diagnosis not present

## 2022-09-12 DIAGNOSIS — K219 Gastro-esophageal reflux disease without esophagitis: Secondary | ICD-10-CM | POA: Diagnosis not present

## 2022-09-16 DIAGNOSIS — M6281 Muscle weakness (generalized): Secondary | ICD-10-CM | POA: Diagnosis not present

## 2022-09-16 DIAGNOSIS — S42201D Unspecified fracture of upper end of right humerus, subsequent encounter for fracture with routine healing: Secondary | ICD-10-CM | POA: Diagnosis not present

## 2022-09-21 DIAGNOSIS — M6281 Muscle weakness (generalized): Secondary | ICD-10-CM | POA: Diagnosis not present

## 2022-09-21 DIAGNOSIS — S42201D Unspecified fracture of upper end of right humerus, subsequent encounter for fracture with routine healing: Secondary | ICD-10-CM | POA: Diagnosis not present

## 2022-09-23 ENCOUNTER — Other Ambulatory Visit: Payer: Self-pay | Admitting: Cardiovascular Disease

## 2022-09-23 ENCOUNTER — Other Ambulatory Visit: Payer: Self-pay

## 2022-09-23 DIAGNOSIS — M6281 Muscle weakness (generalized): Secondary | ICD-10-CM | POA: Diagnosis not present

## 2022-09-23 DIAGNOSIS — S42201D Unspecified fracture of upper end of right humerus, subsequent encounter for fracture with routine healing: Secondary | ICD-10-CM | POA: Diagnosis not present

## 2022-09-26 DIAGNOSIS — S42201D Unspecified fracture of upper end of right humerus, subsequent encounter for fracture with routine healing: Secondary | ICD-10-CM | POA: Diagnosis not present

## 2022-09-27 DIAGNOSIS — M6281 Muscle weakness (generalized): Secondary | ICD-10-CM | POA: Diagnosis not present

## 2022-09-27 DIAGNOSIS — S42201D Unspecified fracture of upper end of right humerus, subsequent encounter for fracture with routine healing: Secondary | ICD-10-CM | POA: Diagnosis not present

## 2022-09-29 ENCOUNTER — Encounter: Payer: Self-pay | Admitting: Cardiovascular Disease

## 2022-09-29 DIAGNOSIS — S42201D Unspecified fracture of upper end of right humerus, subsequent encounter for fracture with routine healing: Secondary | ICD-10-CM | POA: Diagnosis not present

## 2022-09-29 DIAGNOSIS — M6281 Muscle weakness (generalized): Secondary | ICD-10-CM | POA: Diagnosis not present

## 2022-09-29 NOTE — Progress Notes (Signed)
Cardiology Office Note:    Date:  09/30/2022   ID:  Jody Oneill, DOB July 18, 1946, MRN [id number]  PCP:  Glenis Smoker, MD  Cardiologist:  Oswin Johal   Referring MD: Glenis Smoker, *   Chief Complaint  Patient presents with   Congestive Heart Failure   Hypertension      April 02, 2018     Jody Oneill is a 76 y.o. female with a hx of chronic diastolic congestive heart failure, obstructive sleep apnea, type 2 diabetes mellitus, hypertension, chronic kidney disease.  She was last seen in our office in September, 2018 by Richardson Dopp, PA.  She has had significant shortness of breath.  Echocardiogram performed in 2018 reveals normal left ventricular systolic function with moderate diastolic dysfunction.  Nuclear stress test was low risk without ischemia.  BP is high this am.  Has been eating out more recently  Keeps a record of her BP .  Typical BP is 145-150  Goes to the [facility] - does silver sneakers and does water aerobics  No CP or dyspnea  - has some DOE when climbing steps .    January 01, 2019: Seen today for follow-up of her congestive heart failure, hypertension, obesity.  She also has diabetes mellitus.  Wt today is 215 lbs - down from 224 in April , 2019 She is getting some exercise.  She overall seems to be feeling quite a bit better.  July 31, 2020: Jody Oneill is seen today for follow-up of her chronic diastolic congestive heart failure, hypertension, obesity.  She also has diabetes mellitus.  Weight today is 226 which is up 11 pounds from last year. Feeling well  Does water aerobics 3 days a week ,  She cannot swim  Can only walk slowly .   Suggested stationary bike  Eating lots of carbs.      Oct, 13, 2023   Jody Oneill is seen for follow up of her chronic diastolic CHF, HTN, obesity Wt is 205 lbs  Has cut back on her carbs  She broke her R shoulder ( feel up her garage steps)  - is healing , no surgery needed.   She had labs with Dr. Lindell Noe  in September.  Her total cholesterol is 194 HDL is 62 LDL is 117 Triglyceride level is 88 Creatinine is 1.5 Hemoglobin is 11.3 TSH is 0.9 Exercise and weight loss and will keep    Past Medical History:  Diagnosis Date   Chronic diastolic CHF (congestive heart failure) ([place]) 06/15/2015   Chronic kidney disease    Fatty liver    GERD (gastroesophageal reflux disease)    Gout    History of echocardiogram    Echo 8/18: mild conc LVH, EF 55-60, no RWMA, Gr 2 DD, mod LAE   History of nuclear stress test    Nuclear stress test 8/18: EF 65, no ischemia or scar, normal study   Hypertension    Hypothyroid    Obesity    fatty liver by [place]   OSA on CPAP    Osteoarthritis    Type 2 diabetes mellitus ([place])     Past Surgical History:  Procedure Laterality Date   BIOPSY BREAST Left    many years ago   BREAST BIOPSY Left    many years ago- benign   COLONOSCOPY WITH PROPOFOL N/A 10/21/2019   Procedure: COLONOSCOPY WITH PROPOFOL;  Surgeon: Laurence Spates, MD;  Location: [facility];  Service: Endoscopy;  Laterality: N/A;   POLYPECTOMY  10/21/2019   Procedure: POLYPECTOMY;  Surgeon: Laurence Spates, MD;  Location: [facility];  Service: Endoscopy;;    Current Medications: Current Meds  Medication Sig   acetaminophen (TYLENOL) 325 MG tablet Take 650 mg by mouth every 6 (six) hours as needed for mild pain, moderate pain or headache.   atorvastatin (LIPITOR) 20 MG tablet Take 20 mg by mouth daily.   clobetasol cream (TEMOVATE) 7.67 % Apply 1 application topically as needed (Rash).   FREESTYLE LITE test strip 1 each by Other route daily.   furosemide (LASIX) 40 MG tablet TAKE 1 TABLET BY MOUTH EVERY DAY   hydrALAZINE (APRESOLINE) 100 MG tablet Take 50 mg by mouth 2 (two) times daily.   isosorbide mononitrate (IMDUR) 60 MG 24 hr tablet Take 60 mg by mouth daily.   JARDIANCE 25 MG TABS tablet Take 25 mg by mouth daily.   Lancets (FREESTYLE) lancets 1 each by Other route 2 (two) times  daily.   lansoprazole (PREVACID) 30 MG capsule Take 30 mg by mouth daily as needed (heartburn).    levothyroxine (SYNTHROID, LEVOTHROID) 88 MCG tablet Take 88 mcg by mouth daily before breakfast.   lisinopril (PRINIVIL,ZESTRIL) 40 MG tablet Take 1 tablet (40 mg total) by mouth daily.   metoprolol succinate (TOPROL-XL) 100 MG 24 hr tablet TAKE 1 TABLET BY MOUTH EVERY DAY WITH OR IMMEDIATELY FOLLOWING A MEAL   Multiple Vitamin (MULTIVITAMIN) tablet Take 1 tablet by mouth 2 (two) times daily.    OZEMPIC, 1 MG/DOSE, 4 MG/3ML SOPN Inject 1 mg into the skin once a week.   potassium chloride (KLOR-CON) 10 MEQ tablet TAKE 1 TABLET BY MOUTH 2 TIMES DAILY.     Allergies:   Amlodipine besylate and Hydrochlorothiazide   Social History   Socioeconomic History   Marital status: Widowed    Spouse name: Not on file   Number of children: Not on file   Years of education: Not on file   Highest education level: Not on file  Occupational History   Not on file  Tobacco Use   Smoking status: Never   Smokeless tobacco: Never  Vaping Use   Vaping Use: Never used  Substance and Sexual Activity   Alcohol use: No    Alcohol/week: 0.0 standard drinks of alcohol   Drug use: No   Sexual activity: Not on file  Other Topics Concern   Not on file  Social History Narrative   Tobacco use  Cigarettes: Never smoked ,Tobacco history updated 10/23/2014   Alcohol  : yes occasionally . Caffeine  Yes, rare no recreational drugs .   Exercise Yes   -three times a week water aerobics   Occupation unemployed /retired   Martial status single widowed (2006)    2 daughters    Seat belt use - yes      Social Determinants of Radio broadcast assistant Strain: Not on file  Food Insecurity: Not on file  Transportation Needs: Not on file  Physical Activity: Not on file  Stress: Not on file  Social Connections: Not on file     Family History: The patient's family history includes Breast cancer in her maternal aunt  and maternal aunt; Diabetes in her father; Lung cancer in her mother; Other in her father.  ROS:   Please see the history of present illness.     All other systems reviewed and are negative.  EKGs/Labs/Other Studies Reviewed:    The following studies were reviewed today:  Recent Labs: No results found for requested labs within last 365 days.  Recent Lipid Panel No results found for: "CHOL", "TRIG", "HDL", "CHOLHDL", "VLDL", "LDLCALC", "LDLDIRECT"  Physical Exam: Blood pressure 130/68, pulse 66, height '5\' 3"'$  (1.6 m), weight 205 lb (93 kg).     GEN:  Well nourished, well developed in no acute distress HEENT: Normal NECK: No JVD; No carotid bruits LYMPHATICS: No lymphadenopathy CARDIAC: RRR , no murmurs, rubs, gallops RESPIRATORY:  Clear to auscultation without rales, wheezing or rhonchi  ABDOMEN: Soft, non-tender, non-distended MUSCULOSKELETAL:  No edema; No deformity  SKIN: Warm and dry NEUROLOGIC:  Alert and oriented x 3    EKG: September 30, 2022: Normal sinus rhythm at 66.  Right bundle branch block.  No acute changes.   ASSESSMENT:    1. Chronic diastolic CHF (congestive heart failure) ([place])   2. Essential hypertension   3. RBBB (right bundle branch block)     PLAN:      Chronic diastolic congestive heart failure:stable,  lungs are clear, no leg edema .  Breathing is good .  Continue current meds  2.  Essential hypertension - BP is well controlled.      Medication Adjustments/Labs and Tests Ordered: Current medicines are reviewed at length with the patient today.  Concerns regarding medicines are outlined above.  Orders Placed This Encounter  Procedures   EKG 12-Lead   No orders of the defined types were placed in this encounter.     Signed, Mertie Moores, MD  09/30/2022 5:49 PM    [place]

## 2022-09-30 ENCOUNTER — Ambulatory Visit: Payer: Medicare Other | Attending: Cardiovascular Disease | Admitting: Cardiovascular Disease

## 2022-09-30 ENCOUNTER — Encounter: Payer: Self-pay | Admitting: Cardiovascular Disease

## 2022-09-30 VITALS — BP 130/68 | HR 66 | Ht 63.0 in | Wt 205.0 lb

## 2022-09-30 DIAGNOSIS — I5032 Chronic diastolic (congestive) heart failure: Secondary | ICD-10-CM | POA: Diagnosis not present

## 2022-09-30 DIAGNOSIS — I10 Essential (primary) hypertension: Secondary | ICD-10-CM | POA: Diagnosis not present

## 2022-09-30 DIAGNOSIS — I4510 Unspecified right bundle-branch block: Secondary | ICD-10-CM | POA: Diagnosis not present

## 2022-09-30 NOTE — Patient Instructions (Signed)
Medication Instructions:  Your physician recommends that you continue on your current medications as directed. Please refer to the Current Medication list given to you today.  *If you need a refill on your cardiac medications before your next appointment, please call your pharmacy*   Lab Work: NONE If you have labs (blood work) drawn today and your tests are completely normal, you will receive your results only by: MyChart Message (if you have MyChart) OR A paper copy in the mail If you have any lab test that is abnormal or we need to change your treatment, we will call you to review the results.   Testing/Procedures: NONE   Follow-Up: At [facility], you and your health needs are our priority.  As part of our continuing mission to provide you with exceptional heart care, we have created designated Provider Care Teams.  These Care Teams include your primary Cardiologist (physician) and Advanced Practice Providers (APPs -  Physician Assistants and Nurse Practitioners) who all work together to provide you with the care you need, when you need it.  Your next appointment:   1 year(s)  The format for your next appointment:   In Person  Provider:   Philip Nahser, MD    Important Information About Sugar       

## 2022-10-02 ENCOUNTER — Other Ambulatory Visit: Payer: Self-pay | Admitting: Cardiovascular Disease

## 2022-10-06 DIAGNOSIS — M6281 Muscle weakness (generalized): Secondary | ICD-10-CM | POA: Diagnosis not present

## 2022-10-06 DIAGNOSIS — S42201D Unspecified fracture of upper end of right humerus, subsequent encounter for fracture with routine healing: Secondary | ICD-10-CM | POA: Diagnosis not present

## 2022-10-08 DIAGNOSIS — Z23 Encounter for immunization: Secondary | ICD-10-CM | POA: Diagnosis not present

## 2022-10-10 DIAGNOSIS — M6281 Muscle weakness (generalized): Secondary | ICD-10-CM | POA: Diagnosis not present

## 2022-10-10 DIAGNOSIS — S42201D Unspecified fracture of upper end of right humerus, subsequent encounter for fracture with routine healing: Secondary | ICD-10-CM | POA: Diagnosis not present

## 2022-10-13 DIAGNOSIS — M6281 Muscle weakness (generalized): Secondary | ICD-10-CM | POA: Diagnosis not present

## 2022-10-13 DIAGNOSIS — S42201D Unspecified fracture of upper end of right humerus, subsequent encounter for fracture with routine healing: Secondary | ICD-10-CM | POA: Diagnosis not present

## 2022-10-19 DIAGNOSIS — S42201D Unspecified fracture of upper end of right humerus, subsequent encounter for fracture with routine healing: Secondary | ICD-10-CM | POA: Diagnosis not present

## 2022-10-19 DIAGNOSIS — M6281 Muscle weakness (generalized): Secondary | ICD-10-CM | POA: Diagnosis not present

## 2022-10-21 DIAGNOSIS — M6281 Muscle weakness (generalized): Secondary | ICD-10-CM | POA: Diagnosis not present

## 2022-10-21 DIAGNOSIS — S42201D Unspecified fracture of upper end of right humerus, subsequent encounter for fracture with routine healing: Secondary | ICD-10-CM | POA: Diagnosis not present

## 2022-10-25 DIAGNOSIS — S42201D Unspecified fracture of upper end of right humerus, subsequent encounter for fracture with routine healing: Secondary | ICD-10-CM | POA: Diagnosis not present

## 2022-10-25 DIAGNOSIS — M6281 Muscle weakness (generalized): Secondary | ICD-10-CM | POA: Diagnosis not present

## 2022-10-27 DIAGNOSIS — M6281 Muscle weakness (generalized): Secondary | ICD-10-CM | POA: Diagnosis not present

## 2022-10-27 DIAGNOSIS — S42201D Unspecified fracture of upper end of right humerus, subsequent encounter for fracture with routine healing: Secondary | ICD-10-CM | POA: Diagnosis not present

## 2022-11-03 DIAGNOSIS — M6281 Muscle weakness (generalized): Secondary | ICD-10-CM | POA: Diagnosis not present

## 2022-11-03 DIAGNOSIS — S42201D Unspecified fracture of upper end of right humerus, subsequent encounter for fracture with routine healing: Secondary | ICD-10-CM | POA: Diagnosis not present

## 2022-11-04 ENCOUNTER — Other Ambulatory Visit: Payer: Self-pay | Admitting: Cardiovascular Disease

## 2022-11-07 DIAGNOSIS — S42201D Unspecified fracture of upper end of right humerus, subsequent encounter for fracture with routine healing: Secondary | ICD-10-CM | POA: Diagnosis not present

## 2022-11-07 DIAGNOSIS — E7800 Pure hypercholesterolemia, unspecified: Secondary | ICD-10-CM | POA: Diagnosis not present

## 2022-11-08 DIAGNOSIS — M6281 Muscle weakness (generalized): Secondary | ICD-10-CM | POA: Diagnosis not present

## 2022-11-08 DIAGNOSIS — S42201D Unspecified fracture of upper end of right humerus, subsequent encounter for fracture with routine healing: Secondary | ICD-10-CM | POA: Diagnosis not present

## 2022-11-29 DIAGNOSIS — N1831 Chronic kidney disease, stage 3a: Secondary | ICD-10-CM | POA: Diagnosis not present

## 2022-11-29 DIAGNOSIS — N189 Chronic kidney disease, unspecified: Secondary | ICD-10-CM | POA: Diagnosis not present

## 2022-12-04 ENCOUNTER — Other Ambulatory Visit: Payer: Self-pay | Admitting: Cardiovascular Disease

## 2022-12-04 DIAGNOSIS — I10 Essential (primary) hypertension: Secondary | ICD-10-CM

## 2022-12-05 DIAGNOSIS — D631 Anemia in chronic kidney disease: Secondary | ICD-10-CM | POA: Diagnosis not present

## 2022-12-05 DIAGNOSIS — E1122 Type 2 diabetes mellitus with diabetic chronic kidney disease: Secondary | ICD-10-CM | POA: Diagnosis not present

## 2022-12-05 DIAGNOSIS — N2581 Secondary hyperparathyroidism of renal origin: Secondary | ICD-10-CM | POA: Diagnosis not present

## 2022-12-05 DIAGNOSIS — N1832 Chronic kidney disease, stage 3b: Secondary | ICD-10-CM | POA: Diagnosis not present

## 2022-12-05 DIAGNOSIS — I129 Hypertensive chronic kidney disease with stage 1 through stage 4 chronic kidney disease, or unspecified chronic kidney disease: Secondary | ICD-10-CM | POA: Diagnosis not present

## 2022-12-05 NOTE — Telephone Encounter (Signed)
Rx refill sent to pharmacy. 

## 2022-12-29 DIAGNOSIS — M5440 Lumbago with sciatica, unspecified side: Secondary | ICD-10-CM | POA: Diagnosis not present

## 2023-02-10 DIAGNOSIS — M5431 Sciatica, right side: Secondary | ICD-10-CM | POA: Diagnosis not present

## 2023-03-05 ENCOUNTER — Other Ambulatory Visit: Payer: Self-pay | Admitting: Cardiovascular Disease

## 2023-03-09 DIAGNOSIS — Z23 Encounter for immunization: Secondary | ICD-10-CM | POA: Diagnosis not present

## 2023-03-09 DIAGNOSIS — E039 Hypothyroidism, unspecified: Secondary | ICD-10-CM | POA: Diagnosis not present

## 2023-03-09 DIAGNOSIS — E1122 Type 2 diabetes mellitus with diabetic chronic kidney disease: Secondary | ICD-10-CM | POA: Diagnosis not present

## 2023-03-09 DIAGNOSIS — I130 Hypertensive heart and chronic kidney disease with heart failure and stage 1 through stage 4 chronic kidney disease, or unspecified chronic kidney disease: Secondary | ICD-10-CM | POA: Diagnosis not present

## 2023-03-09 DIAGNOSIS — I10 Essential (primary) hypertension: Secondary | ICD-10-CM | POA: Diagnosis not present

## 2023-03-10 DIAGNOSIS — M5431 Sciatica, right side: Secondary | ICD-10-CM | POA: Diagnosis not present

## 2023-03-20 DIAGNOSIS — N840 Polyp of corpus uteri: Secondary | ICD-10-CM | POA: Diagnosis not present

## 2023-03-20 DIAGNOSIS — N858 Other specified noninflammatory disorders of uterus: Secondary | ICD-10-CM | POA: Diagnosis not present

## 2023-03-20 DIAGNOSIS — N950 Postmenopausal bleeding: Secondary | ICD-10-CM | POA: Diagnosis not present

## 2023-04-25 DIAGNOSIS — E119 Type 2 diabetes mellitus without complications: Secondary | ICD-10-CM | POA: Diagnosis not present

## 2023-05-10 ENCOUNTER — Other Ambulatory Visit: Payer: Self-pay | Admitting: Family Medicine

## 2023-05-10 ENCOUNTER — Encounter: Payer: Self-pay | Admitting: Cardiovascular Disease

## 2023-05-10 DIAGNOSIS — Z1231 Encounter for screening mammogram for malignant neoplasm of breast: Secondary | ICD-10-CM

## 2023-05-22 DIAGNOSIS — I10 Essential (primary) hypertension: Secondary | ICD-10-CM | POA: Diagnosis not present

## 2023-05-22 DIAGNOSIS — I5032 Chronic diastolic (congestive) heart failure: Secondary | ICD-10-CM | POA: Diagnosis not present

## 2023-05-22 DIAGNOSIS — G4733 Obstructive sleep apnea (adult) (pediatric): Secondary | ICD-10-CM | POA: Diagnosis not present

## 2023-05-29 DIAGNOSIS — E7800 Pure hypercholesterolemia, unspecified: Secondary | ICD-10-CM | POA: Diagnosis not present

## 2023-05-29 DIAGNOSIS — I5032 Chronic diastolic (congestive) heart failure: Secondary | ICD-10-CM | POA: Diagnosis not present

## 2023-05-29 DIAGNOSIS — N183 Chronic kidney disease, stage 3 unspecified: Secondary | ICD-10-CM | POA: Diagnosis not present

## 2023-05-29 DIAGNOSIS — I10 Essential (primary) hypertension: Secondary | ICD-10-CM | POA: Diagnosis not present

## 2023-05-29 DIAGNOSIS — E1122 Type 2 diabetes mellitus with diabetic chronic kidney disease: Secondary | ICD-10-CM | POA: Diagnosis not present

## 2023-05-29 DIAGNOSIS — E039 Hypothyroidism, unspecified: Secondary | ICD-10-CM | POA: Diagnosis not present

## 2023-05-29 DIAGNOSIS — M8580 Other specified disorders of bone density and structure, unspecified site: Secondary | ICD-10-CM | POA: Diagnosis not present

## 2023-05-29 DIAGNOSIS — K219 Gastro-esophageal reflux disease without esophagitis: Secondary | ICD-10-CM | POA: Diagnosis not present

## 2023-05-29 DIAGNOSIS — M1990 Unspecified osteoarthritis, unspecified site: Secondary | ICD-10-CM | POA: Diagnosis not present

## 2023-06-19 DIAGNOSIS — Z01411 Encounter for gynecological examination (general) (routine) with abnormal findings: Secondary | ICD-10-CM | POA: Diagnosis not present

## 2023-06-19 DIAGNOSIS — M8580 Other specified disorders of bone density and structure, unspecified site: Secondary | ICD-10-CM | POA: Diagnosis not present

## 2023-06-19 DIAGNOSIS — Z01419 Encounter for gynecological examination (general) (routine) without abnormal findings: Secondary | ICD-10-CM | POA: Diagnosis not present

## 2023-06-19 DIAGNOSIS — Z124 Encounter for screening for malignant neoplasm of cervix: Secondary | ICD-10-CM | POA: Diagnosis not present

## 2023-06-26 ENCOUNTER — Ambulatory Visit
Admission: RE | Admit: 2023-06-26 | Discharge: 2023-06-26 | Disposition: A | Discharge status: Home / Self Care | Payer: Medicare Other | Source: Ambulatory Visit | Attending: Family Medicine | Admitting: Family Medicine

## 2023-06-26 DIAGNOSIS — Z1231 Encounter for screening mammogram for malignant neoplasm of breast: Secondary | ICD-10-CM

## 2023-07-05 DIAGNOSIS — N2581 Secondary hyperparathyroidism of renal origin: Secondary | ICD-10-CM | POA: Diagnosis not present

## 2023-07-05 DIAGNOSIS — D631 Anemia in chronic kidney disease: Secondary | ICD-10-CM | POA: Diagnosis not present

## 2023-07-05 DIAGNOSIS — N189 Chronic kidney disease, unspecified: Secondary | ICD-10-CM | POA: Diagnosis not present

## 2023-07-05 DIAGNOSIS — I129 Hypertensive chronic kidney disease with stage 1 through stage 4 chronic kidney disease, or unspecified chronic kidney disease: Secondary | ICD-10-CM | POA: Diagnosis not present

## 2023-07-05 DIAGNOSIS — E1122 Type 2 diabetes mellitus with diabetic chronic kidney disease: Secondary | ICD-10-CM | POA: Diagnosis not present

## 2023-07-05 DIAGNOSIS — N1832 Chronic kidney disease, stage 3b: Secondary | ICD-10-CM | POA: Diagnosis not present

## 2023-08-09 ENCOUNTER — Other Ambulatory Visit: Payer: Self-pay | Admitting: Cardiovascular Disease

## 2023-08-09 DIAGNOSIS — I10 Essential (primary) hypertension: Secondary | ICD-10-CM

## 2023-08-25 ENCOUNTER — Other Ambulatory Visit: Payer: Self-pay

## 2023-08-25 MED ORDER — HYDRALAZINE HCL 100 MG PO TABS: 50.0000 mg | ORAL_TABLET | Freq: Two times a day (BID) | ORAL | 0 refills | Status: AC

## 2023-08-25 NOTE — Telephone Encounter (Signed)
Pt's medication was sent to pt's pharmacy as requested. Confirmation received.  °

## 2023-09-13 DIAGNOSIS — E2839 Other primary ovarian failure: Secondary | ICD-10-CM | POA: Diagnosis not present

## 2023-09-13 DIAGNOSIS — Z23 Encounter for immunization: Secondary | ICD-10-CM | POA: Diagnosis not present

## 2023-09-13 DIAGNOSIS — D649 Anemia, unspecified: Secondary | ICD-10-CM | POA: Diagnosis not present

## 2023-09-13 DIAGNOSIS — I10 Essential (primary) hypertension: Secondary | ICD-10-CM | POA: Diagnosis not present

## 2023-09-13 DIAGNOSIS — E7800 Pure hypercholesterolemia, unspecified: Secondary | ICD-10-CM | POA: Diagnosis not present

## 2023-09-13 DIAGNOSIS — G4733 Obstructive sleep apnea (adult) (pediatric): Secondary | ICD-10-CM | POA: Diagnosis not present

## 2023-09-13 DIAGNOSIS — E1122 Type 2 diabetes mellitus with diabetic chronic kidney disease: Secondary | ICD-10-CM | POA: Diagnosis not present

## 2023-09-13 DIAGNOSIS — N183 Chronic kidney disease, stage 3 unspecified: Secondary | ICD-10-CM | POA: Diagnosis not present

## 2023-09-13 DIAGNOSIS — I130 Hypertensive heart and chronic kidney disease with heart failure and stage 1 through stage 4 chronic kidney disease, or unspecified chronic kidney disease: Secondary | ICD-10-CM | POA: Diagnosis not present

## 2023-09-13 DIAGNOSIS — E039 Hypothyroidism, unspecified: Secondary | ICD-10-CM | POA: Diagnosis not present

## 2023-09-13 DIAGNOSIS — Z0000 Encounter for general adult medical examination without abnormal findings: Secondary | ICD-10-CM | POA: Diagnosis not present

## 2023-09-13 LAB — LAB REPORT - SCANNED: EGFR: 38

## 2023-09-14 DIAGNOSIS — Z23 Encounter for immunization: Secondary | ICD-10-CM | POA: Diagnosis not present

## 2023-10-09 ENCOUNTER — Encounter: Payer: Self-pay | Admitting: Cardiovascular Disease

## 2023-10-09 NOTE — Progress Notes (Unsigned)
Cardiology Office Note:    Date:  10/10/2023   ID:  THEMA HOLSON, DOB [birth year], MRN [id number]  PCP:  Shon Hale, MD  Cardiologist:  Stephenia Vogan   Referring MD: Shon Hale, *   Chief Complaint  Patient presents with   Congestive Heart Failure        Hypertension           April 02, 2018     Jody Oneill is a 76 y.o. female with a hx of chronic diastolic congestive heart failure, obstructive sleep apnea, type 2 diabetes mellitus, hypertension, chronic kidney disease.  She was last seen in our office in September, 2018 by Tereso Newcomer, PA.  She has had significant shortness of breath.  Echocardiogram performed in 2018 reveals normal left ventricular systolic function with moderate diastolic dysfunction.  Nuclear stress test was low risk without ischemia.  BP is high this am.  Has been eating out more recently  Keeps a record of her BP .  Typical BP is 145-150  Goes to the [facility] - does silver sneakers and does water aerobics  No CP or dyspnea  - has some DOE when climbing steps .    January 01, 2019: Seen today for follow-up of her congestive heart failure, hypertension, obesity.  She also has diabetes mellitus.  Wt today is 215 lbs - down from 224 in April , 2019 She is getting some exercise.  She overall seems to be feeling quite a bit better.  July 31, 2020: Ingri is seen today for follow-up of her chronic diastolic congestive heart failure, hypertension, obesity.  She also has diabetes mellitus.  Weight today is 226 which is up 11 pounds from last year. Feeling well  Does water aerobics 3 days a week ,  She cannot swim  Can only walk slowly .   Suggested stationary bike  Eating lots of carbs.      Oct, 13, 2023   Jody Oneill is seen for follow up of her chronic diastolic CHF, HTN, obesity Wt is 205 lbs  Has cut back on her carbs  She broke her R shoulder ( fell up her garage steps)  - is healing , no surgery needed.   She had labs with  Dr. Chanetta Marshall in September.  Her total cholesterol is 194 HDL is 62 LDL is 117 Triglyceride level is 88 Creatinine is 1.5 Hemoglobin is 11.3 TSH is 0.9    Oct. 22 , 2024 Jody Oneill is seen for follow up of her CHF, HTN, obesity Wt is  215 lbs   Echocardiogram from August, 2018.  Shows normal left ventricular systolic function.  She has grade 2 diastolic dysfunction. Has some DOE climbing stairs  No CP  Does water aerobics 3 times a week  Avoids salt      Past Medical History:  Diagnosis Date   Chronic diastolic CHF (congestive heart failure) ([facility]) 06/15/2015   Chronic kidney disease    Fatty liver    GERD (gastroesophageal reflux disease)    Gout    History of echocardiogram    Echo 8/18: mild conc LVH, EF 55-60, no RWMA, Gr 2 DD, mod LAE   History of nuclear stress test    Nuclear stress test 8/18: EF 65, no ischemia or scar, normal study   Hypertension    Hypothyroid    Obesity    fatty liver by [place]   OSA on CPAP    Osteoarthritis    Type 2 diabetes mellitus (  [facility])     Past Surgical History:  Procedure Laterality Date   BIOPSY BREAST Left    many years ago   BREAST BIOPSY Left    many years ago- benign   COLONOSCOPY WITH PROPOFOL N/A 10/21/2019   Procedure: COLONOSCOPY WITH PROPOFOL;  Surgeon: Carman Ching, MD;  [facility];  Service: Endoscopy;  Laterality: N/A;   POLYPECTOMY  10/21/2019   Procedure: POLYPECTOMY;  Surgeon: Carman Ching, MD;  [facility];  Service: Endoscopy;;    Current Medications: Current Meds  Medication Sig   acetaminophen (TYLENOL) 325 MG tablet Take 650 mg by mouth every 6 (six) hours as needed for mild pain, moderate pain or headache.   atorvastatin (LIPITOR) 20 MG tablet Take 20 mg by mouth daily.   clobetasol cream (TEMOVATE) 0.05 % Apply 1 application topically as needed (Rash).   FREESTYLE LITE test strip 1 each by Other route daily.   furosemide (LASIX) 40 MG tablet TAKE 1 TABLET BY MOUTH EVERY DAY    hydrALAZINE (APRESOLINE) 100 MG tablet Take 0.5 tablets (50 mg total) by mouth 2 (two) times daily.   isosorbide mononitrate (IMDUR) 60 MG 24 hr tablet Take 1 tablet (60 mg total) by mouth daily.   JARDIANCE 25 MG TABS tablet Take 25 mg by mouth daily.   Lancets (FREESTYLE) lancets 1 each by Other route 2 (two) times daily.   lansoprazole (PREVACID) 30 MG capsule Take 30 mg by mouth daily as needed (heartburn).    levothyroxine (SYNTHROID, LEVOTHROID) 88 MCG tablet Take 88 mcg by mouth daily before breakfast.   lisinopril (PRINIVIL,ZESTRIL) 40 MG tablet Take 1 tablet (40 mg total) by mouth daily.   metoprolol succinate (TOPROL-XL) 100 MG 24 hr tablet TAKE 1 TABLET BY MOUTH EVERY DAY WITH OR IMMEDIATELY FOLLOWING A MEAL   Multiple Vitamin (MULTIVITAMIN) tablet Take 1 tablet by mouth 2 (two) times daily.    OZEMPIC, 1 MG/DOSE, 4 MG/3ML SOPN Inject 1 mg into the skin once a week.   potassium chloride (KLOR-CON) 10 MEQ tablet TAKE 1 TABLET BY MOUTH TWICE A DAY   [DISCONTINUED] isosorbide mononitrate (IMDUR) 60 MG 24 hr tablet TAKE 1 AND 1/2 TABLETS DAILY BY MOUTH     Allergies:   Amlodipine besylate and Hydrochlorothiazide   Social History   Socioeconomic History   Marital status: Widowed    Spouse name: Not on file   Number of children: Not on file   Years of education: Not on file   Highest education level: Not on file  Occupational History   Not on file  Tobacco Use   Smoking status: Never   Smokeless tobacco: Never  Vaping Use   Vaping status: Never Used  Substance and Sexual Activity   Alcohol use: No    Alcohol/week: 0.0 standard drinks of alcohol   Drug use: No   Sexual activity: Not on file  Other Topics Concern   Not on file  Social History Narrative   Tobacco use  Cigarettes: Never smoked ,Tobacco history updated 10/23/2014   Alcohol  : yes occasionally . Caffeine  Yes, rare no recreational drugs .   Exercise Yes   -three times a week water aerobics   Occupation  unemployed /retired   Martial status single widowed (2006)    2 daughters    Seat belt use - yes      Social Determinants of Corporate investment banker Strain: Not on file  Food Insecurity: Not on file  Transportation Needs:  Not on file  Physical Activity: Not on file  Stress: Not on file  Social Connections: Not on file     Family History: The patient's family history includes Breast cancer in her maternal aunt and maternal aunt; Diabetes in her father; Lung cancer in her mother; Other in her father.  ROS:   Please see the history of present illness.     All other systems reviewed and are negative.  EKGs/Labs/Other Studies Reviewed:    The following studies were reviewed today:     Recent Labs: No results found for requested labs within last 365 days.  Recent Lipid Panel No results found for: "CHOL", "TRIG", "HDL", "CHOLHDL", "VLDL", "LDLCALC", "LDLDIRECT"     Physical Exam: Blood pressure 108/82, pulse 69, height 5\' 3"  (1.6 m), weight 215 lb 6.4 oz (97.7 kg), SpO2 98%.       GEN:  Well nourished, mildly obese elderly female,  in no acute distress HEENT: Normal NECK: No JVD; No carotid bruits LYMPHATICS: No lymphadenopathy CARDIAC: RRR , no murmurs, rubs, gallops RESPIRATORY:  Clear to auscultation without rales, wheezing or rhonchi  ABDOMEN: Soft, non-tender, non-distended MUSCULOSKELETAL:  No edema; No deformity  SKIN: Warm and dry NEUROLOGIC:  Alert and oriented x 3   EKG:    EKG Interpretation Date/Time:  Tuesday October 10 2023 11:30:00 EDT Ventricular Rate:  69 PR Interval:  184 QRS Duration:  140 QT Interval:  444 QTC Calculation: 475 R Axis:   -37  Text Interpretation: Normal sinus rhythm Left axis deviation Right bundle branch block Possible Lateral infarct , age undetermined When compared with ECG of 17-May-2021 17:35, PREVIOUS ECG IS PRESENT Confirmed by Kristeen Miss ([id number]) on 10/10/2023 5:50:43 PM      ASSESSMENT:    1. Follow-up  exam   2. Chronic diastolic CHF (congestive heart failure) ([facility])   3. Essential hypertension   4. DOE (dyspnea on exertion)      PLAN:      Chronic diastolic congestive heart failure: Vennie presents for further evaluation of her chronic diastolic congestive heart failure.  She continues to have shortness of breath with exertion.  She does water aerobics on a regular basis.  Will  repeat her echocardiogram for further assessment of her LV function.  I have advised her to continue to work on low-salt diet, carb restriction, calorie reduction.   2.  Essential hypertension - BP is well controlled.       Medication Adjustments/Labs and Tests Ordered: Current medicines are reviewed at length with the patient today.  Concerns regarding medicines are outlined above.  Orders Placed This Encounter  Procedures   EKG 12-Lead   EKG 12-Lead   ECHOCARDIOGRAM COMPLETE   Meds ordered this encounter  Medications   isosorbide mononitrate (IMDUR) 60 MG 24 hr tablet    Sig: Take 1 tablet (60 mg total) by mouth daily.    Dispense:  90 tablet    Refill:  3      Signed, Kristeen Miss, MD  10/10/2023 6:09 PM    [facility]

## 2023-10-10 ENCOUNTER — Encounter: Payer: Self-pay | Admitting: Cardiovascular Disease

## 2023-10-10 ENCOUNTER — Ambulatory Visit: Payer: Medicare Other | Attending: Cardiovascular Disease | Admitting: Cardiovascular Disease

## 2023-10-10 VITALS — BP 108/82 | HR 69 | Ht 63.0 in | Wt 215.4 lb

## 2023-10-10 DIAGNOSIS — R0609 Other forms of dyspnea: Secondary | ICD-10-CM | POA: Diagnosis not present

## 2023-10-10 DIAGNOSIS — Z09 Encounter for follow-up examination after completed treatment for conditions other than malignant neoplasm: Secondary | ICD-10-CM

## 2023-10-10 DIAGNOSIS — I10 Essential (primary) hypertension: Secondary | ICD-10-CM

## 2023-10-10 DIAGNOSIS — I5032 Chronic diastolic (congestive) heart failure: Secondary | ICD-10-CM

## 2023-10-10 DIAGNOSIS — I4510 Unspecified right bundle-branch block: Secondary | ICD-10-CM

## 2023-10-10 MED ORDER — ISOSORBIDE MONONITRATE ER 60 MG PO TB24: 60.0000 mg | ORAL_TABLET | Freq: Every day | ORAL | 3 refills | Status: DC

## 2023-10-10 NOTE — Patient Instructions (Signed)
Medication Instructions:  DECREASE Imdur/isosorbide to 60mg  daily *If you need a refill on your cardiac medications before your next appointment, please call your pharmacy*  Testing/Procedures: ECHO Your physician has requested that you have an echocardiogram. Echocardiography is a painless test that uses sound waves to create images of your heart. It provides your doctor with information about the size and shape of your heart and how well your heart's chambers and valves are working. This procedure takes approximately one hour. There are no restrictions for this procedure. Please do NOT wear cologne, perfume, aftershave, or lotions (deodorant is allowed). Please arrive 15 minutes prior to your appointment time.  Follow-Up: At [facility], you and your health needs are our priority.  As part of our continuing mission to provide you with exceptional heart care, we have created designated Provider Care Teams.  These Care Teams include your primary Cardiologist (physician) and Advanced Practice Providers (APPs -  Physician Assistants and Nurse Practitioners) who all work together to provide you with the care you need, when you need it.  Your next appointment:   1 year(s)  Provider:   Kristeen Miss, MD

## 2023-10-17 ENCOUNTER — Ambulatory Visit (HOSPITAL_COMMUNITY): Payer: Medicare Other | Attending: Cardiology

## 2023-10-17 DIAGNOSIS — I5032 Chronic diastolic (congestive) heart failure: Secondary | ICD-10-CM | POA: Insufficient documentation

## 2023-10-17 DIAGNOSIS — R0609 Other forms of dyspnea: Secondary | ICD-10-CM | POA: Diagnosis not present

## 2023-10-17 LAB — ECHOCARDIOGRAM COMPLETE
Area-P 1/2: 3.26 cm2
S' Lateral: 2.4 cm

## 2023-10-20 NOTE — Progress Notes (Signed)
Order(s) created erroneously. Erroneous order ID: [id number]  Order moved by: Ian Malkin  Order move date/time: 10/20/2023 10:05 AM  Source Patient: [id number]  Source Contact: 10/10/2023  Destination Patient: [id number]  Destination Contact: 05/31/2023

## 2023-11-02 DIAGNOSIS — Z820 Family history of epilepsy and other diseases of the nervous system: Secondary | ICD-10-CM | POA: Diagnosis not present

## 2023-11-02 DIAGNOSIS — M6281 Muscle weakness (generalized): Secondary | ICD-10-CM | POA: Diagnosis not present

## 2023-11-02 DIAGNOSIS — G4733 Obstructive sleep apnea (adult) (pediatric): Secondary | ICD-10-CM | POA: Diagnosis not present

## 2023-11-02 DIAGNOSIS — Z1379 Encounter for other screening for genetic and chromosomal anomalies: Secondary | ICD-10-CM | POA: Diagnosis not present

## 2023-11-20 ENCOUNTER — Other Ambulatory Visit: Payer: Self-pay | Admitting: Cardiovascular Disease

## 2023-12-30 ENCOUNTER — Other Ambulatory Visit: Payer: Self-pay | Admitting: Cardiovascular Disease

## 2023-12-30 DIAGNOSIS — I10 Essential (primary) hypertension: Secondary | ICD-10-CM

## 2023-12-31 ENCOUNTER — Other Ambulatory Visit: Payer: Self-pay | Admitting: Cardiovascular Disease

## 2024-01-10 DIAGNOSIS — E1122 Type 2 diabetes mellitus with diabetic chronic kidney disease: Secondary | ICD-10-CM | POA: Diagnosis not present

## 2024-01-10 DIAGNOSIS — D631 Anemia in chronic kidney disease: Secondary | ICD-10-CM | POA: Diagnosis not present

## 2024-01-10 DIAGNOSIS — N189 Chronic kidney disease, unspecified: Secondary | ICD-10-CM | POA: Diagnosis not present

## 2024-01-10 DIAGNOSIS — E1129 Type 2 diabetes mellitus with other diabetic kidney complication: Secondary | ICD-10-CM | POA: Diagnosis not present

## 2024-01-10 DIAGNOSIS — N2581 Secondary hyperparathyroidism of renal origin: Secondary | ICD-10-CM | POA: Diagnosis not present

## 2024-01-10 DIAGNOSIS — I5030 Unspecified diastolic (congestive) heart failure: Secondary | ICD-10-CM | POA: Diagnosis not present

## 2024-01-10 DIAGNOSIS — N1832 Chronic kidney disease, stage 3b: Secondary | ICD-10-CM | POA: Diagnosis not present

## 2024-01-10 DIAGNOSIS — I129 Hypertensive chronic kidney disease with stage 1 through stage 4 chronic kidney disease, or unspecified chronic kidney disease: Secondary | ICD-10-CM | POA: Diagnosis not present

## 2024-03-13 DIAGNOSIS — N1832 Chronic kidney disease, stage 3b: Secondary | ICD-10-CM | POA: Diagnosis not present

## 2024-03-13 DIAGNOSIS — D649 Anemia, unspecified: Secondary | ICD-10-CM | POA: Diagnosis not present

## 2024-03-13 DIAGNOSIS — R220 Localized swelling, mass and lump, head: Secondary | ICD-10-CM | POA: Diagnosis not present

## 2024-03-13 DIAGNOSIS — I10 Essential (primary) hypertension: Secondary | ICD-10-CM | POA: Diagnosis not present

## 2024-03-13 DIAGNOSIS — E1122 Type 2 diabetes mellitus with diabetic chronic kidney disease: Secondary | ICD-10-CM | POA: Diagnosis not present

## 2024-03-13 DIAGNOSIS — I130 Hypertensive heart and chronic kidney disease with heart failure and stage 1 through stage 4 chronic kidney disease, or unspecified chronic kidney disease: Secondary | ICD-10-CM | POA: Diagnosis not present

## 2024-03-13 DIAGNOSIS — E039 Hypothyroidism, unspecified: Secondary | ICD-10-CM | POA: Diagnosis not present

## 2024-03-18 DIAGNOSIS — E1122 Type 2 diabetes mellitus with diabetic chronic kidney disease: Secondary | ICD-10-CM | POA: Diagnosis not present

## 2024-03-18 DIAGNOSIS — N1832 Chronic kidney disease, stage 3b: Secondary | ICD-10-CM | POA: Diagnosis not present

## 2024-03-18 DIAGNOSIS — I130 Hypertensive heart and chronic kidney disease with heart failure and stage 1 through stage 4 chronic kidney disease, or unspecified chronic kidney disease: Secondary | ICD-10-CM | POA: Diagnosis not present

## 2024-04-11 DIAGNOSIS — I130 Hypertensive heart and chronic kidney disease with heart failure and stage 1 through stage 4 chronic kidney disease, or unspecified chronic kidney disease: Secondary | ICD-10-CM | POA: Diagnosis not present

## 2024-04-11 DIAGNOSIS — N1832 Chronic kidney disease, stage 3b: Secondary | ICD-10-CM | POA: Diagnosis not present

## 2024-04-11 DIAGNOSIS — I10 Essential (primary) hypertension: Secondary | ICD-10-CM | POA: Diagnosis not present

## 2024-04-11 DIAGNOSIS — E1122 Type 2 diabetes mellitus with diabetic chronic kidney disease: Secondary | ICD-10-CM | POA: Diagnosis not present

## 2024-04-15 DIAGNOSIS — T783XXD Angioneurotic edema, subsequent encounter: Secondary | ICD-10-CM | POA: Diagnosis not present

## 2024-04-15 DIAGNOSIS — J3089 Other allergic rhinitis: Secondary | ICD-10-CM | POA: Diagnosis not present

## 2024-04-17 DIAGNOSIS — N1832 Chronic kidney disease, stage 3b: Secondary | ICD-10-CM | POA: Diagnosis not present

## 2024-04-17 DIAGNOSIS — E1122 Type 2 diabetes mellitus with diabetic chronic kidney disease: Secondary | ICD-10-CM | POA: Diagnosis not present

## 2024-04-17 DIAGNOSIS — I10 Essential (primary) hypertension: Secondary | ICD-10-CM | POA: Diagnosis not present

## 2024-04-17 DIAGNOSIS — I130 Hypertensive heart and chronic kidney disease with heart failure and stage 1 through stage 4 chronic kidney disease, or unspecified chronic kidney disease: Secondary | ICD-10-CM | POA: Diagnosis not present

## 2024-04-18 ENCOUNTER — Telehealth: Payer: Self-pay | Admitting: Cardiovascular Disease

## 2024-04-18 NOTE — Telephone Encounter (Signed)
 Left voicemail to return call to office

## 2024-04-18 NOTE — Telephone Encounter (Signed)
 Officed called to say the patient was referred to us  for tongue swelling and they believe the lisinopril  (PRINIVIL ,ZESTRIL ) 40 MG tablet  is the cause for tongue. Recommend she stop medication and change to non ACE inhibitor. Please advise

## 2024-04-22 NOTE — Telephone Encounter (Signed)
 Spoke with patient and she has d/c lisinopril .  She states she is also allergic to amlodipine

## 2024-04-22 NOTE — Telephone Encounter (Signed)
 Left voicemail to return call to office

## 2024-04-23 NOTE — Telephone Encounter (Signed)
 Called and spoke to patient. Chart does currently reflect allergies to Lisinopril  and Amlodipine, so no changes made at this time. She verbalzied that she is still currently taking Hydralazine  50mg  twice daily, metoprolol  Succinate 100mg  daily, and Imdur  60mg  daily (all of which can lower/impact BP). She will continue to monitor her BP moving forward. She states yesterday was her first full day off of the Lisinopril  and her BP was "131/60-something."  She will call back if any issues.  Do not start amlodipine ( intolerence )  PN

## 2024-05-08 ENCOUNTER — Telehealth: Payer: Self-pay | Admitting: Student

## 2024-05-08 DIAGNOSIS — R9431 Abnormal electrocardiogram [ECG] [EKG]: Secondary | ICD-10-CM | POA: Diagnosis not present

## 2024-05-08 DIAGNOSIS — R42 Dizziness and giddiness: Secondary | ICD-10-CM | POA: Diagnosis not present

## 2024-05-08 NOTE — Telephone Encounter (Signed)
 The patients daughter called the after hours call line from urgent care. The patient had vomiting and near syncope. She went to [facility] physicians and they did an EKG. The EKG showed left axis deviation and RBBB. She was calling to see if these changes were new or if they were present on prior EKG. Communicated that the patient did have a RBBB and LAD on prior EKG's. She will continue workup with urgent care as they advise. Advised to call back after finishing urgent care visit with [facility] during normal office hours to get a follow up appointment scheduled.

## 2024-05-09 ENCOUNTER — Telehealth: Payer: Self-pay | Admitting: Cardiovascular Disease

## 2024-05-09 NOTE — Telephone Encounter (Signed)
 STAT if patient feels like he/she is going to faint   1. Are you feeling dizzy, lightheaded, or faint right now? no    2. Have you passed out?  no (If yes move to .SYNCOPECHMG)   3. Do you have any other symptoms? Vomiting, felt like she was going to pass out   4. Have you checked your HR and BP (record if available)? No Pt went to [facility] on [address] and they told her EKG was abn

## 2024-05-09 NOTE — Telephone Encounter (Signed)
 Patient identification verified by 2 forms. Sims Duck, RN    Called patient. No answer. LVMTCB.  DOD db slot available on 5/27 with oneal.

## 2024-05-10 NOTE — Telephone Encounter (Signed)
 Called and spoke with patient who states that two days ago "all of a sudden, I felt like I was going to pass out, got all hot and sweaty." States she was standing at her kitchen counter cutting chicken-nothing vigorous. She did go throw up and states she felt a little relief. Visit from [facility] in chart. BP and HR=122/72, 66bpm. The episode lasted approximately 10 to 15 minutes, during which she felt weak. By that evening she was fine. Denies pain of any type (chest/jaw/back/arm).  She had not done any recent activity or anything out of her normal routine. She said that she's fine now and it was a one time occurrence that she calls " a spell." Offered next available DOD spot, but pt declined. She states if this happens again, she will call us .

## 2024-05-11 DIAGNOSIS — I130 Hypertensive heart and chronic kidney disease with heart failure and stage 1 through stage 4 chronic kidney disease, or unspecified chronic kidney disease: Secondary | ICD-10-CM | POA: Diagnosis not present

## 2024-05-11 DIAGNOSIS — E1122 Type 2 diabetes mellitus with diabetic chronic kidney disease: Secondary | ICD-10-CM | POA: Diagnosis not present

## 2024-05-11 DIAGNOSIS — I10 Essential (primary) hypertension: Secondary | ICD-10-CM | POA: Diagnosis not present

## 2024-05-14 ENCOUNTER — Other Ambulatory Visit: Payer: Self-pay | Admitting: Family Medicine

## 2024-05-14 DIAGNOSIS — Z1231 Encounter for screening mammogram for malignant neoplasm of breast: Secondary | ICD-10-CM

## 2024-05-18 DIAGNOSIS — N1832 Chronic kidney disease, stage 3b: Secondary | ICD-10-CM | POA: Diagnosis not present

## 2024-05-18 DIAGNOSIS — I130 Hypertensive heart and chronic kidney disease with heart failure and stage 1 through stage 4 chronic kidney disease, or unspecified chronic kidney disease: Secondary | ICD-10-CM | POA: Diagnosis not present

## 2024-05-18 DIAGNOSIS — I10 Essential (primary) hypertension: Secondary | ICD-10-CM | POA: Diagnosis not present

## 2024-05-18 DIAGNOSIS — E1122 Type 2 diabetes mellitus with diabetic chronic kidney disease: Secondary | ICD-10-CM | POA: Diagnosis not present

## 2024-06-10 DIAGNOSIS — I130 Hypertensive heart and chronic kidney disease with heart failure and stage 1 through stage 4 chronic kidney disease, or unspecified chronic kidney disease: Secondary | ICD-10-CM | POA: Diagnosis not present

## 2024-06-10 DIAGNOSIS — I10 Essential (primary) hypertension: Secondary | ICD-10-CM | POA: Diagnosis not present

## 2024-06-10 DIAGNOSIS — E1122 Type 2 diabetes mellitus with diabetic chronic kidney disease: Secondary | ICD-10-CM | POA: Diagnosis not present

## 2024-06-10 DIAGNOSIS — N183 Chronic kidney disease, stage 3 unspecified: Secondary | ICD-10-CM | POA: Diagnosis not present

## 2024-06-13 DIAGNOSIS — E119 Type 2 diabetes mellitus without complications: Secondary | ICD-10-CM | POA: Diagnosis not present

## 2024-06-13 DIAGNOSIS — H35033 Hypertensive retinopathy, bilateral: Secondary | ICD-10-CM | POA: Diagnosis not present

## 2024-06-13 DIAGNOSIS — Z961 Presence of intraocular lens: Secondary | ICD-10-CM | POA: Diagnosis not present

## 2024-06-17 DIAGNOSIS — I10 Essential (primary) hypertension: Secondary | ICD-10-CM | POA: Diagnosis not present

## 2024-06-17 DIAGNOSIS — E1122 Type 2 diabetes mellitus with diabetic chronic kidney disease: Secondary | ICD-10-CM | POA: Diagnosis not present

## 2024-06-17 DIAGNOSIS — I130 Hypertensive heart and chronic kidney disease with heart failure and stage 1 through stage 4 chronic kidney disease, or unspecified chronic kidney disease: Secondary | ICD-10-CM | POA: Diagnosis not present

## 2024-06-17 DIAGNOSIS — N183 Chronic kidney disease, stage 3 unspecified: Secondary | ICD-10-CM | POA: Diagnosis not present

## 2024-06-27 ENCOUNTER — Ambulatory Visit
Admission: RE | Admit: 2024-06-27 | Discharge: 2024-06-27 | Disposition: A | Discharge status: Home / Self Care | Source: Ambulatory Visit | Attending: Family Medicine | Admitting: Family Medicine

## 2024-06-27 DIAGNOSIS — Z1231 Encounter for screening mammogram for malignant neoplasm of breast: Secondary | ICD-10-CM

## 2024-07-04 DIAGNOSIS — N1832 Chronic kidney disease, stage 3b: Secondary | ICD-10-CM | POA: Diagnosis not present

## 2024-07-10 DIAGNOSIS — N183 Chronic kidney disease, stage 3 unspecified: Secondary | ICD-10-CM | POA: Diagnosis not present

## 2024-07-10 DIAGNOSIS — I130 Hypertensive heart and chronic kidney disease with heart failure and stage 1 through stage 4 chronic kidney disease, or unspecified chronic kidney disease: Secondary | ICD-10-CM | POA: Diagnosis not present

## 2024-07-10 DIAGNOSIS — E1122 Type 2 diabetes mellitus with diabetic chronic kidney disease: Secondary | ICD-10-CM | POA: Diagnosis not present

## 2024-07-10 DIAGNOSIS — I10 Essential (primary) hypertension: Secondary | ICD-10-CM | POA: Diagnosis not present

## 2024-07-11 DIAGNOSIS — E1122 Type 2 diabetes mellitus with diabetic chronic kidney disease: Secondary | ICD-10-CM | POA: Diagnosis not present

## 2024-07-11 DIAGNOSIS — I129 Hypertensive chronic kidney disease with stage 1 through stage 4 chronic kidney disease, or unspecified chronic kidney disease: Secondary | ICD-10-CM | POA: Diagnosis not present

## 2024-07-11 DIAGNOSIS — N2581 Secondary hyperparathyroidism of renal origin: Secondary | ICD-10-CM | POA: Diagnosis not present

## 2024-07-11 DIAGNOSIS — D631 Anemia in chronic kidney disease: Secondary | ICD-10-CM | POA: Diagnosis not present

## 2024-07-11 DIAGNOSIS — I5030 Unspecified diastolic (congestive) heart failure: Secondary | ICD-10-CM | POA: Diagnosis not present

## 2024-07-11 DIAGNOSIS — N1832 Chronic kidney disease, stage 3b: Secondary | ICD-10-CM | POA: Diagnosis not present

## 2024-07-18 DIAGNOSIS — N183 Chronic kidney disease, stage 3 unspecified: Secondary | ICD-10-CM | POA: Diagnosis not present

## 2024-07-18 DIAGNOSIS — I10 Essential (primary) hypertension: Secondary | ICD-10-CM | POA: Diagnosis not present

## 2024-07-18 DIAGNOSIS — E1122 Type 2 diabetes mellitus with diabetic chronic kidney disease: Secondary | ICD-10-CM | POA: Diagnosis not present

## 2024-07-18 DIAGNOSIS — I130 Hypertensive heart and chronic kidney disease with heart failure and stage 1 through stage 4 chronic kidney disease, or unspecified chronic kidney disease: Secondary | ICD-10-CM | POA: Diagnosis not present

## 2024-08-06 DIAGNOSIS — G4733 Obstructive sleep apnea (adult) (pediatric): Secondary | ICD-10-CM | POA: Diagnosis not present

## 2024-08-06 DIAGNOSIS — I10 Essential (primary) hypertension: Secondary | ICD-10-CM | POA: Diagnosis not present

## 2024-08-06 DIAGNOSIS — I130 Hypertensive heart and chronic kidney disease with heart failure and stage 1 through stage 4 chronic kidney disease, or unspecified chronic kidney disease: Secondary | ICD-10-CM | POA: Diagnosis not present

## 2024-08-09 DIAGNOSIS — E1122 Type 2 diabetes mellitus with diabetic chronic kidney disease: Secondary | ICD-10-CM | POA: Diagnosis not present

## 2024-08-09 DIAGNOSIS — N183 Chronic kidney disease, stage 3 unspecified: Secondary | ICD-10-CM | POA: Diagnosis not present

## 2024-08-09 DIAGNOSIS — I130 Hypertensive heart and chronic kidney disease with heart failure and stage 1 through stage 4 chronic kidney disease, or unspecified chronic kidney disease: Secondary | ICD-10-CM | POA: Diagnosis not present

## 2024-08-09 DIAGNOSIS — I10 Essential (primary) hypertension: Secondary | ICD-10-CM | POA: Diagnosis not present

## 2024-08-18 DIAGNOSIS — E1122 Type 2 diabetes mellitus with diabetic chronic kidney disease: Secondary | ICD-10-CM | POA: Diagnosis not present

## 2024-08-18 DIAGNOSIS — I10 Essential (primary) hypertension: Secondary | ICD-10-CM | POA: Diagnosis not present

## 2024-08-18 DIAGNOSIS — I130 Hypertensive heart and chronic kidney disease with heart failure and stage 1 through stage 4 chronic kidney disease, or unspecified chronic kidney disease: Secondary | ICD-10-CM | POA: Diagnosis not present

## 2024-08-18 DIAGNOSIS — N183 Chronic kidney disease, stage 3 unspecified: Secondary | ICD-10-CM | POA: Diagnosis not present

## 2024-08-30 ENCOUNTER — Other Ambulatory Visit: Payer: Self-pay

## 2024-08-30 DIAGNOSIS — I10 Essential (primary) hypertension: Secondary | ICD-10-CM

## 2024-09-08 DIAGNOSIS — E1122 Type 2 diabetes mellitus with diabetic chronic kidney disease: Secondary | ICD-10-CM | POA: Diagnosis not present

## 2024-09-08 DIAGNOSIS — N183 Chronic kidney disease, stage 3 unspecified: Secondary | ICD-10-CM | POA: Diagnosis not present

## 2024-09-08 DIAGNOSIS — I130 Hypertensive heart and chronic kidney disease with heart failure and stage 1 through stage 4 chronic kidney disease, or unspecified chronic kidney disease: Secondary | ICD-10-CM | POA: Diagnosis not present

## 2024-09-08 DIAGNOSIS — I10 Essential (primary) hypertension: Secondary | ICD-10-CM | POA: Diagnosis not present

## 2024-09-17 DIAGNOSIS — I130 Hypertensive heart and chronic kidney disease with heart failure and stage 1 through stage 4 chronic kidney disease, or unspecified chronic kidney disease: Secondary | ICD-10-CM | POA: Diagnosis not present

## 2024-09-17 DIAGNOSIS — E1122 Type 2 diabetes mellitus with diabetic chronic kidney disease: Secondary | ICD-10-CM | POA: Diagnosis not present

## 2024-09-17 DIAGNOSIS — N183 Chronic kidney disease, stage 3 unspecified: Secondary | ICD-10-CM | POA: Diagnosis not present

## 2024-09-17 DIAGNOSIS — I10 Essential (primary) hypertension: Secondary | ICD-10-CM | POA: Diagnosis not present

## 2024-09-17 DIAGNOSIS — E039 Hypothyroidism, unspecified: Secondary | ICD-10-CM | POA: Diagnosis not present

## 2024-09-26 DIAGNOSIS — G4733 Obstructive sleep apnea (adult) (pediatric): Secondary | ICD-10-CM | POA: Diagnosis not present

## 2024-09-26 DIAGNOSIS — I130 Hypertensive heart and chronic kidney disease with heart failure and stage 1 through stage 4 chronic kidney disease, or unspecified chronic kidney disease: Secondary | ICD-10-CM | POA: Diagnosis not present

## 2024-09-26 DIAGNOSIS — E1122 Type 2 diabetes mellitus with diabetic chronic kidney disease: Secondary | ICD-10-CM | POA: Diagnosis not present

## 2024-09-26 DIAGNOSIS — Z0000 Encounter for general adult medical examination without abnormal findings: Secondary | ICD-10-CM | POA: Diagnosis not present

## 2024-09-26 DIAGNOSIS — E7800 Pure hypercholesterolemia, unspecified: Secondary | ICD-10-CM | POA: Diagnosis not present

## 2024-09-26 DIAGNOSIS — N1831 Chronic kidney disease, stage 3a: Secondary | ICD-10-CM | POA: Diagnosis not present

## 2024-09-26 DIAGNOSIS — D649 Anemia, unspecified: Secondary | ICD-10-CM | POA: Diagnosis not present

## 2024-09-26 DIAGNOSIS — I10 Essential (primary) hypertension: Secondary | ICD-10-CM | POA: Diagnosis not present

## 2024-09-26 DIAGNOSIS — E039 Hypothyroidism, unspecified: Secondary | ICD-10-CM | POA: Diagnosis not present

## 2024-09-26 DIAGNOSIS — Z23 Encounter for immunization: Secondary | ICD-10-CM | POA: Diagnosis not present

## 2024-09-26 DIAGNOSIS — E2839 Other primary ovarian failure: Secondary | ICD-10-CM | POA: Diagnosis not present

## 2024-09-28 ENCOUNTER — Other Ambulatory Visit (HOSPITAL_BASED_OUTPATIENT_CLINIC_OR_DEPARTMENT_OTHER): Payer: Self-pay | Admitting: Family Medicine

## 2024-09-28 DIAGNOSIS — E2839 Other primary ovarian failure: Secondary | ICD-10-CM

## 2024-10-02 ENCOUNTER — Other Ambulatory Visit: Payer: Self-pay

## 2024-10-02 DIAGNOSIS — Z86018 Personal history of other benign neoplasm: Secondary | ICD-10-CM | POA: Diagnosis not present

## 2024-10-02 DIAGNOSIS — R748 Abnormal levels of other serum enzymes: Secondary | ICD-10-CM | POA: Diagnosis not present

## 2024-10-08 DIAGNOSIS — I10 Essential (primary) hypertension: Secondary | ICD-10-CM | POA: Diagnosis not present

## 2024-10-08 DIAGNOSIS — E1122 Type 2 diabetes mellitus with diabetic chronic kidney disease: Secondary | ICD-10-CM | POA: Diagnosis not present

## 2024-10-08 DIAGNOSIS — N183 Chronic kidney disease, stage 3 unspecified: Secondary | ICD-10-CM | POA: Diagnosis not present

## 2024-10-08 DIAGNOSIS — I130 Hypertensive heart and chronic kidney disease with heart failure and stage 1 through stage 4 chronic kidney disease, or unspecified chronic kidney disease: Secondary | ICD-10-CM | POA: Diagnosis not present

## 2024-10-14 ENCOUNTER — Ambulatory Visit
Admission: RE | Admit: 2024-10-14 | Discharge: 2024-10-14 | Disposition: A | Discharge status: Home / Self Care | Source: Ambulatory Visit

## 2024-10-14 DIAGNOSIS — R7989 Other specified abnormal findings of blood chemistry: Secondary | ICD-10-CM | POA: Diagnosis not present

## 2024-10-14 DIAGNOSIS — R748 Abnormal levels of other serum enzymes: Secondary | ICD-10-CM

## 2024-10-17 ENCOUNTER — Ambulatory Visit: Attending: Internal Medicine

## 2024-10-17 VITALS — BP 181/92 | HR 69 | Ht 63.0 in | Wt 204.0 lb

## 2024-10-17 DIAGNOSIS — I452 Bifascicular block: Secondary | ICD-10-CM | POA: Diagnosis not present

## 2024-10-17 DIAGNOSIS — E119 Type 2 diabetes mellitus without complications: Secondary | ICD-10-CM | POA: Insufficient documentation

## 2024-10-17 DIAGNOSIS — I5032 Chronic diastolic (congestive) heart failure: Secondary | ICD-10-CM | POA: Diagnosis not present

## 2024-10-17 DIAGNOSIS — I10 Essential (primary) hypertension: Secondary | ICD-10-CM | POA: Diagnosis not present

## 2024-10-17 MED ORDER — METOPROLOL SUCCINATE ER 100 MG PO TB24: 100.0000 mg | ORAL_TABLET | Freq: Every day | ORAL | 2 refills | Status: AC

## 2024-10-17 MED ORDER — POTASSIUM CHLORIDE ER 10 MEQ PO TBCR
10.0000 meq | EXTENDED_RELEASE_TABLET | Freq: Two times a day (BID) | ORAL | 2 refills | Status: AC
Start: 2024-10-17 — End: ?

## 2024-10-17 NOTE — Patient Instructions (Signed)
 Medication Instructions:  Your physician recommends that you continue on your current medications as directed. Please refer to the Current Medication list given to you today.  *If you need a refill on your cardiac medications before your next appointment, please call your pharmacy*  Lab Work: None.  If you have labs (blood work) drawn today and your tests are completely normal, you will receive your results only by: MyChart Message (if you have MyChart) OR A paper copy in the mail If you have any lab test that is abnormal or we need to change your treatment, we will call you to review the results.  Testing/Procedures: None.  Follow-Up: At [facility], you and your health needs are our priority.  As part of our continuing mission to provide you with exceptional heart care, our providers are all part of one team.  This team includes your primary Cardiologist (physician) and Advanced Practice Providers or APPs (Physician Assistants and Nurse Practitioners) who all work together to provide you with the care you need, when you need it.  Your next appointment:   1 year(s)  Provider:   Dr. PHEBE Cedars, MD   We recommend signing up for the patient portal called MyChart.  Sign up information is provided on this After Visit Summary.  MyChart is used to connect with patients for Virtual Visits (Telemedicine).  Patients are able to view lab/test results, encounter notes, upcoming appointments, etc.  Non-urgent messages can be sent to your provider as well.   To learn more about what you can do with MyChart, go to [email or web address].

## 2024-10-17 NOTE — Progress Notes (Unsigned)
       Cardiology Office Note Date:  10/17/2024  ID:  Jody Oneill, Jody Oneill July 10, 1946, MRN [id number] PCP:  Chrystal Lamarr RAMAN, MD  Cardiologist:  Joelle VEAR Ren Donley, MD  No chief complaint on file.    Problems HFpEF TTE 10/24 55-60% MPI 2018 no evidence of ischemia OSA DM HTN CKDIII M: AN20, FE40, ISMN60, EN25, XL100, SE, KCL10BID  Visits  LV 10/24: ongoing dyspnea --> TTE     History of Present Illness: Jody Oneill is a 78 y.o. female who presents for follow up.   She reports feeling well since last visit about a year ago.  [facility].  She reports resolution of her shortness of breath from a year ago and denies any chest pain exertion.  She also denies any lower extremity edema orthopnea or PND.  She has been adherent to all her medication including her diuretic.  Her blood pressure was elevated here with systolic up to 181, but she reports normal blood pressure at home with systolic 120s to 869d.  She checks her blood pressure daily and send a log to her PCP.  She has no other concerns today.  ROS: Otherwise negative  Physical Exam VS:  BP (!) 181/92   Pulse 69   Ht 5' 3 (1.6 m)   Wt 204 lb (92.5 kg)   SpO2 99%   BMI 36.14 kg/m  , BMI Body mass index is 36.14 kg/m. GEN: Well nourished, well developed, in no acute distress HEENT: normal Neck: no JVD, carotid bruits, or masses Cardiac: RRR; no murmurs, rubs, or gallops,no edema  Respiratory:  CTAB bilaterally, normal work of breathing GI: soft, nontender, nondistended, + BS Extremities: No LE edema Skin: warm and dry, no rash Neuro:  Strength and sensation are intact  Recent Labs: Reviewed  ASSESSMENT AND PLAN Jody Oneill is a 78 y.o. female who presents for follow up.   #HFpEF #OSA on CPAP #CKDIII #DM - Asymptomatic euvolemic today. - Refilled metop and KCL. - Follow up in  1 year     Signed, Joelle VEAR Ren Donley, MD   10/17/2024 9:28 AM    [facility]

## 2024-10-18 DIAGNOSIS — N183 Chronic kidney disease, stage 3 unspecified: Secondary | ICD-10-CM | POA: Diagnosis not present

## 2024-10-18 DIAGNOSIS — E1122 Type 2 diabetes mellitus with diabetic chronic kidney disease: Secondary | ICD-10-CM | POA: Diagnosis not present

## 2024-10-18 DIAGNOSIS — I130 Hypertensive heart and chronic kidney disease with heart failure and stage 1 through stage 4 chronic kidney disease, or unspecified chronic kidney disease: Secondary | ICD-10-CM | POA: Diagnosis not present

## 2024-10-18 DIAGNOSIS — I10 Essential (primary) hypertension: Secondary | ICD-10-CM | POA: Diagnosis not present

## 2024-10-18 DIAGNOSIS — E039 Hypothyroidism, unspecified: Secondary | ICD-10-CM | POA: Diagnosis not present

## 2024-10-25 DIAGNOSIS — M5441 Lumbago with sciatica, right side: Secondary | ICD-10-CM | POA: Diagnosis not present

## 2024-11-07 DIAGNOSIS — I10 Essential (primary) hypertension: Secondary | ICD-10-CM | POA: Diagnosis not present

## 2024-11-07 DIAGNOSIS — I130 Hypertensive heart and chronic kidney disease with heart failure and stage 1 through stage 4 chronic kidney disease, or unspecified chronic kidney disease: Secondary | ICD-10-CM | POA: Diagnosis not present

## 2024-11-07 DIAGNOSIS — N183 Chronic kidney disease, stage 3 unspecified: Secondary | ICD-10-CM | POA: Diagnosis not present

## 2024-11-07 DIAGNOSIS — E1122 Type 2 diabetes mellitus with diabetic chronic kidney disease: Secondary | ICD-10-CM | POA: Diagnosis not present

## 2024-11-11 ENCOUNTER — Telehealth: Payer: Self-pay

## 2024-11-11 MED ORDER — ISOSORBIDE MONONITRATE ER 60 MG PO TB24: 60.0000 mg | ORAL_TABLET | Freq: Every day | ORAL | 3 refills | Status: AC

## 2024-11-11 NOTE — Telephone Encounter (Signed)
*  STAT* If patient is at the pharmacy, call can be transferred to refill team.   1. Which medications need to be refilled? (please list name of each medication and dose if known)   isosorbide mononitrate (IMDUR) 60 MG 24 hr tablet    2. Which pharmacy/location (including street and city if local pharmacy) is medication to be sent to?[id number] [place] [place] [address]    3. Do they need a 30 day or 90 day supply? [address]

## 2024-11-11 NOTE — Telephone Encounter (Signed)
 Refill sent.

## 2024-11-17 DIAGNOSIS — E1122 Type 2 diabetes mellitus with diabetic chronic kidney disease: Secondary | ICD-10-CM | POA: Diagnosis not present

## 2024-11-17 DIAGNOSIS — E039 Hypothyroidism, unspecified: Secondary | ICD-10-CM | POA: Diagnosis not present

## 2024-11-17 DIAGNOSIS — I10 Essential (primary) hypertension: Secondary | ICD-10-CM | POA: Diagnosis not present

## 2024-11-17 DIAGNOSIS — I130 Hypertensive heart and chronic kidney disease with heart failure and stage 1 through stage 4 chronic kidney disease, or unspecified chronic kidney disease: Secondary | ICD-10-CM | POA: Diagnosis not present

## 2024-11-17 DIAGNOSIS — N183 Chronic kidney disease, stage 3 unspecified: Secondary | ICD-10-CM | POA: Diagnosis not present

## 2024-11-25 ENCOUNTER — Other Ambulatory Visit: Payer: Self-pay

## 2024-11-27 DIAGNOSIS — R748 Abnormal levels of other serum enzymes: Secondary | ICD-10-CM | POA: Diagnosis not present

## 2024-11-27 DIAGNOSIS — K760 Fatty (change of) liver, not elsewhere classified: Secondary | ICD-10-CM | POA: Diagnosis not present

## 2024-11-28 ENCOUNTER — Other Ambulatory Visit: Payer: Self-pay

## 2024-11-28 DIAGNOSIS — K760 Fatty (change of) liver, not elsewhere classified: Secondary | ICD-10-CM

## 2024-11-28 DIAGNOSIS — R748 Abnormal levels of other serum enzymes: Secondary | ICD-10-CM

## 2024-11-28 MED ORDER — FUROSEMIDE 40 MG PO TABS: 40.0000 mg | ORAL_TABLET | Freq: Every day | ORAL | 3 refills | Status: AC

## 2024-12-06 ENCOUNTER — Inpatient Hospital Stay: Admission: RE | Admit: 2024-12-06 | Discharge: 2024-12-06

## 2024-12-06 DIAGNOSIS — K760 Fatty (change of) liver, not elsewhere classified: Secondary | ICD-10-CM

## 2024-12-06 DIAGNOSIS — R748 Abnormal levels of other serum enzymes: Secondary | ICD-10-CM

## 2024-12-06 MED ORDER — IOPAMIDOL (ISOVUE-300) INJECTION 61%
80.0000 mL | Freq: Once | INTRAVENOUS | Status: AC | PRN
  Administered 2024-12-06: 80 mL via INTRAVENOUS

## 2024-12-30 ENCOUNTER — Other Ambulatory Visit (HOSPITAL_COMMUNITY): Payer: Self-pay

## 2024-12-30 ENCOUNTER — Encounter: Payer: Self-pay | Admitting: Family Medicine

## 2024-12-30 DIAGNOSIS — R748 Abnormal levels of other serum enzymes: Secondary | ICD-10-CM

## 2025-01-12 ENCOUNTER — Other Ambulatory Visit: Payer: Self-pay | Admitting: Radiology

## 2025-01-12 DIAGNOSIS — R7989 Other specified abnormal findings of blood chemistry: Secondary | ICD-10-CM

## 2025-01-14 ENCOUNTER — Ambulatory Visit (HOSPITAL_COMMUNITY)

## 2025-01-30 ENCOUNTER — Ambulatory Visit (HOSPITAL_COMMUNITY)
